# Patient Record
Sex: Male | Born: 1935 | Race: White | Hispanic: No | Marital: Single | Smoking: Never smoker
Health system: Southern US, Community
[De-identification: ages and names within clinical notes are randomized; demographics above are authoritative.]

## PROBLEM LIST (undated history)

## (undated) DIAGNOSIS — N486 Induration penis plastica: Secondary | ICD-10-CM

## (undated) DIAGNOSIS — C61 Malignant neoplasm of prostate: Secondary | ICD-10-CM

## (undated) DIAGNOSIS — R194 Change in bowel habit: Secondary | ICD-10-CM

## (undated) DIAGNOSIS — N400 Enlarged prostate without lower urinary tract symptoms: Secondary | ICD-10-CM

## (undated) DIAGNOSIS — R972 Elevated prostate specific antigen [PSA]: Secondary | ICD-10-CM

## (undated) DIAGNOSIS — I10 Essential (primary) hypertension: Secondary | ICD-10-CM

## (undated) DIAGNOSIS — Z8719 Personal history of other diseases of the digestive system: Secondary | ICD-10-CM

## (undated) DIAGNOSIS — E039 Hypothyroidism, unspecified: Secondary | ICD-10-CM

## (undated) DIAGNOSIS — J449 Chronic obstructive pulmonary disease, unspecified: Secondary | ICD-10-CM

## (undated) DIAGNOSIS — D649 Anemia, unspecified: Secondary | ICD-10-CM

## (undated) HISTORY — DX: Benign prostatic hyperplasia without lower urinary tract symptoms: N40.0

## (undated) HISTORY — DX: Elevated prostate specific antigen (PSA): R97.20

## (undated) HISTORY — PX: FOOT SURGERY: SHX648

## (undated) HISTORY — PX: PROSTATE SURGERY: SHX751

## (undated) HISTORY — PX: APPENDECTOMY: SHX54

## (undated) HISTORY — DX: Malignant neoplasm of prostate: C61

## (undated) HISTORY — DX: Induration penis plastica: N48.6

## (undated) HISTORY — DX: Essential (primary) hypertension: I10

---

## 2006-06-11 ENCOUNTER — Ambulatory Visit: Payer: Self-pay | Admitting: Gastroenterology

## 2007-08-04 HISTORY — PX: CRYOABLATION: SHX1415

## 2008-04-16 ENCOUNTER — Ambulatory Visit: Payer: Self-pay | Admitting: Urology

## 2008-04-16 ENCOUNTER — Other Ambulatory Visit: Payer: Self-pay

## 2008-04-24 ENCOUNTER — Ambulatory Visit: Payer: Self-pay | Admitting: Urology

## 2009-01-18 ENCOUNTER — Emergency Department (HOSPITAL_COMMUNITY): Admission: EM | Admit: 2009-01-18 | Discharge: 2009-01-19 | Payer: Self-pay | Admitting: Emergency Medicine

## 2010-11-10 LAB — URINALYSIS, ROUTINE W REFLEX MICROSCOPIC
Ketones, ur: NEGATIVE mg/dL
Nitrite: NEGATIVE
Protein, ur: NEGATIVE mg/dL
Urobilinogen, UA: 0.2 mg/dL (ref 0.0–1.0)
pH: 5 (ref 5.0–8.0)

## 2010-11-10 LAB — COMPREHENSIVE METABOLIC PANEL
Albumin: 3.7 g/dL (ref 3.5–5.2)
BUN: 25 mg/dL — ABNORMAL HIGH (ref 6–23)
Chloride: 105 mEq/L (ref 96–112)
Creatinine, Ser: 1.28 mg/dL (ref 0.4–1.5)
GFR calc non Af Amer: 54 mL/min — ABNORMAL LOW (ref >60)
Total Bilirubin: 0.7 mg/dL (ref 0.3–1.2)

## 2010-11-10 LAB — POCT CARDIAC MARKERS
CKMB, poc: <1 ng/mL — ABNORMAL LOW (ref 1.0–8.0)
Myoglobin, poc: 294 ng/mL (ref 12–200)

## 2010-11-10 LAB — CBC
HCT: 44.8 % (ref 39.0–52.0)
Hemoglobin: 15.2 g/dL (ref 13.0–17.0)
MCHC: 33.8 g/dL (ref 30.0–36.0)
RDW: 12.2 % (ref 11.5–15.5)

## 2010-11-10 LAB — POCT I-STAT, CHEM 8
Calcium, Ion: 1.15 mmol/L (ref 1.12–1.32)
Chloride: 106 mEq/L (ref 96–112)
Glucose, Bld: 149 mg/dL — ABNORMAL HIGH (ref 70–99)
HCT: 46 % (ref 39.0–52.0)

## 2010-11-10 LAB — DIFFERENTIAL
Basophils Absolute: 0 10*3/uL (ref 0.0–0.1)
Basophils Relative: 0 % (ref 0–1)
Eosinophils Absolute: 0 10*3/uL (ref 0.0–0.7)
Eosinophils Relative: 0 % (ref 0–5)
Monocytes Absolute: 0.8 10*3/uL (ref 0.1–1.0)

## 2014-03-31 DIAGNOSIS — D510 Vitamin B12 deficiency anemia due to intrinsic factor deficiency: Secondary | ICD-10-CM | POA: Insufficient documentation

## 2014-03-31 DIAGNOSIS — E039 Hypothyroidism, unspecified: Secondary | ICD-10-CM | POA: Insufficient documentation

## 2014-03-31 DIAGNOSIS — E785 Hyperlipidemia, unspecified: Secondary | ICD-10-CM | POA: Insufficient documentation

## 2015-04-11 DIAGNOSIS — Z0000 Encounter for general adult medical examination without abnormal findings: Secondary | ICD-10-CM | POA: Insufficient documentation

## 2015-05-20 ENCOUNTER — Encounter: Payer: Self-pay | Admitting: *Deleted

## 2015-05-24 ENCOUNTER — Other Ambulatory Visit: Payer: Commercial Managed Care - HMO

## 2015-05-24 DIAGNOSIS — R972 Elevated prostate specific antigen [PSA]: Secondary | ICD-10-CM

## 2015-05-25 LAB — PSA: Prostate Specific Ag, Serum: 0.6 ng/mL (ref 0.0–4.0)

## 2015-05-31 ENCOUNTER — Ambulatory Visit (INDEPENDENT_AMBULATORY_CARE_PROVIDER_SITE_OTHER): Payer: Commercial Managed Care - HMO | Admitting: Urology

## 2015-05-31 ENCOUNTER — Encounter: Payer: Self-pay | Admitting: Urology

## 2015-05-31 VITALS — BP 160/82 | HR 64 | Ht 70.0 in | Wt 173.6 lb

## 2015-05-31 DIAGNOSIS — C61 Malignant neoplasm of prostate: Secondary | ICD-10-CM | POA: Insufficient documentation

## 2015-05-31 NOTE — Progress Notes (Signed)
05/31/2015 11:29 AM   Christian Garcia [birth year] [id number]  Referring provider: Kirk Ruths, MD [facility] [place], [place] [address]  Chief Complaint  Patient presents with  . Bladder Cancer    6 month recheck    HPI: Patient is an 81 year old white male with prostate cancer who presents today for biannual follow-up.  Patient underwent cryoablation in September 2009 for a Gleason's 7 adenocarcinoma of the prostate.  He has no urinary complaints at this time.  He states he feels well.  He denies fevers, chills, nausea or vomiting.  He also is not having dysuria, suprapubic pain or gross hematuria.  He is not having pain, appetite loss or weight loss.   PMH: Past Medical History  Diagnosis Date  . BPH (benign prostatic hypertrophy)   . Elevated PSA   . Peyronie's disease   . HTN (hypertension)   . Prostate cancer [facility])     Surgical History: Past Surgical History  Procedure Laterality Date  . Cryoablation  2009    prostate  . Appendectomy      Home Medications:    Medication List       This list is accurate as of: 05/31/15 11:29 AM.  Always use your most recent med list.               VITAMIN B-12 IJ  Inject 1,000 mLs as directed.        Allergies:  Allergies  Allergen Reactions  . Sulfa Antibiotics     Family History: Family History  Problem Relation Age of Onset  . Prostate cancer Brother     x 3  . Breast cancer Sister   . Kidney disease Neg Hx     Social History:  reports that he has never smoked. He does not have any smokeless tobacco history on file. He reports that he does not drink alcohol or use illicit drugs.  ROS: UROLOGY Frequent Urination?: No Hard to postpone urination?: No Burning/pain with urination?: No Get up at night to urinate?: No Leakage of urine?: No Urine stream starts and stops?: No Trouble starting stream?: No Do you have to strain to urinate?: No Blood in urine?:  No Urinary tract infection?: No Sexually transmitted disease?: No Injury to kidneys or bladder?: No Painful intercourse?: No Weak stream?: No Erection problems?: No Penile pain?: No  Gastrointestinal Nausea?: No Vomiting?: No Indigestion/heartburn?: No Diarrhea?: No Constipation?: No  Constitutional Fever: No Night sweats?: No Weight loss?: No Fatigue?: No  Skin Skin rash/lesions?: No Itching?: No  Eyes Blurred vision?: No Double vision?: No  Ears/Nose/Throat Sore throat?: No Sinus problems?: No  Hematologic/Lymphatic Swollen glands?: No Easy bruising?: No  Cardiovascular Leg swelling?: No Chest pain?: No  Respiratory Cough?: No Shortness of breath?: No  Endocrine Excessive thirst?: No  Musculoskeletal Back pain?: No Joint pain?: No  Neurological Headaches?: No Dizziness?: No  Psychologic Depression?: No Anxiety?: No  Physical Exam: BP 160/82 mmHg  Pulse 64  Ht 5\' 10"  (1.778 m)  Wt 173 lb 9.6 oz (78.744 kg)  BMI 24.91 kg/m2  GU: No CVA tenderness.  No bladder fullness or masses.  Patient with uncircumcised phallus. Foreskin easily retracted  Urethral meatus is patent.  No penile discharge. No penile lesions or rashes. Scrotum without lesions, cysts, rashes and/or edema.  Testicles are located scrotally bilaterally. No masses are appreciated in the testicles. Left and right epididymis are normal. Rectal: Patient with  normal sphincter tone. Anus  and perineum without scarring or rashes. No rectal masses are appreciated. Prostate is approximately 30 grams, no nodules are appreciated. Seminal vesicles are normal.   Laboratory Data: Lab Results  Component Value Date   WBC 13.1* 01/18/2009   HGB 15.6 01/18/2009   HCT 46.0 01/18/2009   MCV 89.1 01/18/2009   PLT 169 01/18/2009    Lab Results  Component Value Date   CREATININE 1.28 01/18/2009    Lab Results  Component Value Date   PSA 0.6 05/24/2015   PSA history  0.6 ng/mL on  05/16/2013  0.5 ng/mL on 11/15/2013  0.6 ng/mL on 05/31/2014  0.5 ng/mL on 11/30/2014   Assessment & Plan:    1. Prostate cancer [facility]):   Patient underwent cryoablation in September 2009 for a Gleason's 7 adenocarcinoma of the prostate.  His PSAs have remained stable in the last few years alternating from 0.5-0.6.  We will continue monitoring every 6 months. He will return for a PSA and exam at that time.   Return in about 6 months (around 11/29/2015) for exam.  Zara Council, [facility]  [facility] [address] [place], [place] [address] [phone]

## 2015-08-06 DIAGNOSIS — E538 Deficiency of other specified B group vitamins: Secondary | ICD-10-CM | POA: Diagnosis not present

## 2015-08-21 DIAGNOSIS — L821 Other seborrheic keratosis: Secondary | ICD-10-CM | POA: Diagnosis not present

## 2015-08-21 DIAGNOSIS — D0439 Carcinoma in situ of skin of other parts of face: Secondary | ICD-10-CM | POA: Diagnosis not present

## 2015-08-21 DIAGNOSIS — Z85828 Personal history of other malignant neoplasm of skin: Secondary | ICD-10-CM | POA: Diagnosis not present

## 2015-08-21 DIAGNOSIS — X32XXXA Exposure to sunlight, initial encounter: Secondary | ICD-10-CM | POA: Diagnosis not present

## 2015-08-21 DIAGNOSIS — D485 Neoplasm of uncertain behavior of skin: Secondary | ICD-10-CM | POA: Diagnosis not present

## 2015-08-21 DIAGNOSIS — L565 Disseminated superficial actinic porokeratosis (DSAP): Secondary | ICD-10-CM | POA: Diagnosis not present

## 2015-08-21 DIAGNOSIS — L570 Actinic keratosis: Secondary | ICD-10-CM | POA: Diagnosis not present

## 2015-09-06 DIAGNOSIS — E538 Deficiency of other specified B group vitamins: Secondary | ICD-10-CM | POA: Diagnosis not present

## 2015-09-18 DIAGNOSIS — L905 Scar conditions and fibrosis of skin: Secondary | ICD-10-CM | POA: Diagnosis not present

## 2015-09-18 DIAGNOSIS — D0439 Carcinoma in situ of skin of other parts of face: Secondary | ICD-10-CM | POA: Diagnosis not present

## 2015-10-02 DIAGNOSIS — D510 Vitamin B12 deficiency anemia due to intrinsic factor deficiency: Secondary | ICD-10-CM | POA: Diagnosis not present

## 2015-10-02 DIAGNOSIS — E7800 Pure hypercholesterolemia, unspecified: Secondary | ICD-10-CM | POA: Diagnosis not present

## 2015-10-02 DIAGNOSIS — E039 Hypothyroidism, unspecified: Secondary | ICD-10-CM | POA: Diagnosis not present

## 2015-10-09 DIAGNOSIS — E7800 Pure hypercholesterolemia, unspecified: Secondary | ICD-10-CM | POA: Diagnosis not present

## 2015-10-09 DIAGNOSIS — E039 Hypothyroidism, unspecified: Secondary | ICD-10-CM | POA: Diagnosis not present

## 2015-10-09 DIAGNOSIS — E538 Deficiency of other specified B group vitamins: Secondary | ICD-10-CM | POA: Diagnosis not present

## 2015-10-09 DIAGNOSIS — D510 Vitamin B12 deficiency anemia due to intrinsic factor deficiency: Secondary | ICD-10-CM | POA: Diagnosis not present

## 2015-11-12 DIAGNOSIS — E538 Deficiency of other specified B group vitamins: Secondary | ICD-10-CM | POA: Diagnosis not present

## 2015-11-22 ENCOUNTER — Other Ambulatory Visit: Payer: PPO

## 2015-11-22 DIAGNOSIS — R972 Elevated prostate specific antigen [PSA]: Secondary | ICD-10-CM

## 2015-11-23 LAB — PSA: Prostate Specific Ag, Serum: 0.6 ng/mL (ref 0.0–4.0)

## 2015-11-29 ENCOUNTER — Encounter: Payer: Self-pay | Admitting: Urology

## 2015-11-29 ENCOUNTER — Ambulatory Visit (INDEPENDENT_AMBULATORY_CARE_PROVIDER_SITE_OTHER): Payer: PPO | Admitting: Urology

## 2015-11-29 VITALS — BP 172/73 | HR 73 | Ht 66.0 in | Wt 182.6 lb

## 2015-11-29 DIAGNOSIS — C61 Malignant neoplasm of prostate: Secondary | ICD-10-CM

## 2015-11-29 NOTE — Progress Notes (Signed)
10:45 AM   Christian Garcia [birth year] [id number]  Referring provider: No referring provider defined for this encounter.  Chief Complaint  Patient presents with  . Prostate Cancer    6 month follow up     HPI: Patient is an 82 year old white male with prostate cancer who presents today for biannual follow-up.  Patient underwent cryoablation in September 2009 for a Gleason's 7 adenocarcinoma of the prostate.  He has no urinary complaints at this time.  He states he feels well.  He denies fevers, chills, nausea or vomiting.  He also is not having dysuria, suprapubic pain or gross hematuria.  He is not having pain, appetite loss or weight loss.  His IPSS score is 0/0.  His recent PSA was 0.6 ng/mL on 11/22/2015.      IPSS      11/29/15 1000       International Prostate Symptom Score   How often have you had the sensation of not emptying your bladder? Not at All     How often have you had to urinate less than every two hours? Not at All     How often have you found you stopped and started again several times when you urinated? Not at All     How often have you found it difficult to postpone urination? Not at All     How often have you had a weak urinary stream? Not at All     How often have you had to strain to start urination? Not at All     How many times did you typically get up at night to urinate? None     Total IPSS Score 0     Quality of Life due to urinary symptoms   If you were to spend the rest of your life with your urinary condition just the way it is now how would you feel about that? Delighted        Score:  1-7 Mild 8-19 Moderate 20-35 Severe   PMH: Past Medical History  Diagnosis Date  . BPH (benign prostatic hypertrophy)   . Elevated PSA   . Peyronie's disease   . HTN (hypertension)   . Prostate cancer [facility])     Surgical History: Past Surgical History  Procedure Laterality Date  . Cryoablation  2009    prostate  . Appendectomy      Home  Medications:    Medication List       This list is accurate as of: 11/29/15 10:45 AM.  Always use your most recent med list.               VITAMIN B-12 IJ  Inject 1,000 mLs as directed.        Allergies:  Allergies  Allergen Reactions  . Sulfa Antibiotics     Family History: Family History  Problem Relation Age of Onset  . Prostate cancer Brother     x 3  . Breast cancer Sister   . Kidney disease Neg Hx     Social History:  reports that he has never smoked. He does not have any smokeless tobacco history on file. He reports that he does not drink alcohol or use illicit drugs.  ROS: UROLOGY Frequent Urination?: No Hard to postpone urination?: No Burning/pain with urination?: No Get up at night to urinate?: No Leakage of urine?: No Urine stream starts and stops?: No Trouble starting stream?: No Do you have to strain to urinate?: No Blood  in urine?: No Urinary tract infection?: No Sexually transmitted disease?: No Injury to kidneys or bladder?: No Painful intercourse?: No Weak stream?: No Erection problems?: No Penile pain?: No  Gastrointestinal Nausea?: No Vomiting?: No Indigestion/heartburn?: No Diarrhea?: No Constipation?: No  Constitutional Fever: No Night sweats?: No Weight loss?: No Fatigue?: No  Skin Skin rash/lesions?: No Itching?: No  Eyes Blurred vision?: No Double vision?: No  Ears/Nose/Throat Sore throat?: No Sinus problems?: No  Hematologic/Lymphatic Swollen glands?: No Easy bruising?: No  Cardiovascular Leg swelling?: No Chest pain?: No  Respiratory Cough?: No Shortness of breath?: No  Endocrine Excessive thirst?: No  Musculoskeletal Back pain?: No Joint pain?: No  Neurological Headaches?: No Dizziness?: No  Psychologic Depression?: No Anxiety?: No  Physical Exam: BP 172/73 mmHg  Pulse 73  Ht 5\' 6"  (1.676 m)  Wt 182 lb 9.6 oz (82.827 kg)  BMI 29.49 kg/m2  GU: No CVA tenderness.  No bladder fullness  or masses.  Patient with uncircumcised phallus. Foreskin easily retracted  Urethral meatus is patent.  No penile discharge. No penile lesions or rashes. Scrotum without lesions, cysts, rashes and/or edema.  Testicles are located scrotally bilaterally. No masses are appreciated in the testicles. Left and right epididymis are normal. Rectal: Patient with  normal sphincter tone. Anus and perineum without scarring or rashes. No rectal masses are appreciated. Prostate is approximately 30 grams, no nodules are appreciated. Seminal vesicles are normal.   Laboratory Data: Lab Results  Component Value Date   WBC 13.1* 01/18/2009   HGB 15.6 01/18/2009   HCT 46.0 01/18/2009   MCV 89.1 01/18/2009   PLT 169 01/18/2009    Lab Results  Component Value Date   CREATININE 1.28 01/18/2009    PSA history  0.6 ng/mL on 05/16/2013  0.5 ng/mL on 11/15/2013  0.6 ng/mL on 05/31/2014  0.5 ng/mL on 11/30/2014  0.6 ng/mL on 05/24/2015  0.6 ng/mL on 11/22/2015     Assessment & Plan:    1. Prostate cancer [facility]):   Patient underwent cryoablation in September 2009 for a Gleason's 7 adenocarcinoma of the prostate.  His PSAs have remained stable in the last few years alternating from 0.5-0.6.  We will continue monitoring every 6 months. He will return for a PSA, IPSS and exam at that time.   Return in about 6 months (around 05/30/2016) for IPSS and score.  Zara Council, [facility] [address], [place], [place] [address] [phone]

## 2015-12-13 DIAGNOSIS — E538 Deficiency of other specified B group vitamins: Secondary | ICD-10-CM | POA: Diagnosis not present

## 2016-01-16 DIAGNOSIS — Z85828 Personal history of other malignant neoplasm of skin: Secondary | ICD-10-CM | POA: Diagnosis not present

## 2016-01-16 DIAGNOSIS — X32XXXA Exposure to sunlight, initial encounter: Secondary | ICD-10-CM | POA: Diagnosis not present

## 2016-01-16 DIAGNOSIS — L821 Other seborrheic keratosis: Secondary | ICD-10-CM | POA: Diagnosis not present

## 2016-01-16 DIAGNOSIS — Z08 Encounter for follow-up examination after completed treatment for malignant neoplasm: Secondary | ICD-10-CM | POA: Diagnosis not present

## 2016-01-16 DIAGNOSIS — L570 Actinic keratosis: Secondary | ICD-10-CM | POA: Diagnosis not present

## 2016-01-21 DIAGNOSIS — E538 Deficiency of other specified B group vitamins: Secondary | ICD-10-CM | POA: Diagnosis not present

## 2016-02-21 DIAGNOSIS — E538 Deficiency of other specified B group vitamins: Secondary | ICD-10-CM | POA: Diagnosis not present

## 2016-03-25 DIAGNOSIS — E538 Deficiency of other specified B group vitamins: Secondary | ICD-10-CM | POA: Diagnosis not present

## 2016-04-07 DIAGNOSIS — E039 Hypothyroidism, unspecified: Secondary | ICD-10-CM | POA: Diagnosis not present

## 2016-04-07 DIAGNOSIS — D510 Vitamin B12 deficiency anemia due to intrinsic factor deficiency: Secondary | ICD-10-CM | POA: Diagnosis not present

## 2016-04-07 DIAGNOSIS — E7800 Pure hypercholesterolemia, unspecified: Secondary | ICD-10-CM | POA: Diagnosis not present

## 2016-04-14 DIAGNOSIS — Z0000 Encounter for general adult medical examination without abnormal findings: Secondary | ICD-10-CM | POA: Diagnosis not present

## 2016-04-14 DIAGNOSIS — D510 Vitamin B12 deficiency anemia due to intrinsic factor deficiency: Secondary | ICD-10-CM | POA: Diagnosis not present

## 2016-04-14 DIAGNOSIS — Z23 Encounter for immunization: Secondary | ICD-10-CM | POA: Diagnosis not present

## 2016-04-14 DIAGNOSIS — E039 Hypothyroidism, unspecified: Secondary | ICD-10-CM | POA: Diagnosis not present

## 2016-04-14 DIAGNOSIS — E7800 Pure hypercholesterolemia, unspecified: Secondary | ICD-10-CM | POA: Diagnosis not present

## 2016-04-28 DIAGNOSIS — E538 Deficiency of other specified B group vitamins: Secondary | ICD-10-CM | POA: Diagnosis not present

## 2016-05-22 ENCOUNTER — Other Ambulatory Visit: Payer: Self-pay

## 2016-05-22 DIAGNOSIS — Z8546 Personal history of malignant neoplasm of prostate: Secondary | ICD-10-CM

## 2016-05-25 ENCOUNTER — Other Ambulatory Visit: Payer: PPO

## 2016-05-25 DIAGNOSIS — Z8546 Personal history of malignant neoplasm of prostate: Secondary | ICD-10-CM

## 2016-05-26 LAB — PSA: PROSTATE SPECIFIC AG, SERUM: 0.7 ng/mL (ref 0.0–4.0)

## 2016-05-29 DIAGNOSIS — E538 Deficiency of other specified B group vitamins: Secondary | ICD-10-CM | POA: Diagnosis not present

## 2016-06-01 ENCOUNTER — Ambulatory Visit: Payer: PPO | Admitting: Urology

## 2016-06-01 ENCOUNTER — Encounter: Payer: Self-pay | Admitting: Urology

## 2016-06-01 VITALS — BP 147/83 | HR 67 | Ht 66.0 in | Wt 182.6 lb

## 2016-06-01 DIAGNOSIS — C61 Malignant neoplasm of prostate: Secondary | ICD-10-CM

## 2016-06-01 NOTE — Progress Notes (Signed)
11:46 AM   Christian Garcia 13-Jul-1929 [id number]  Referring provider: Kirk Ruths, MD [name], [place] [address]  Chief Complaint  Patient presents with  . Prostate Cancer    Follow up    HPI: Patient is an 82 year old Caucasian male with prostate cancer who presents today for biannual follow-up for prostate cancer.  Patient underwent cryoablation in September 2009 for a Gleason's 7 adenocarcinoma of the prostate.  His IPSS score is 0/0.  His previous IPSS score was 0/0.  His recent PSA was 0.7 ng/mL on 05/25/2106.  He has no urinary complaints at this time.  He states he feels well.  He denies fevers, chills, nausea or vomiting.  He also is not having dysuria, suprapubic pain or gross hematuria.  He is not having pain, appetite loss or weight loss.        IPSS    Row Name 06/01/16 1100         International Prostate Symptom Score   How often have you had the sensation of not emptying your bladder? Not at All     How often have you had to urinate less than every two hours? Not at All     How often have you found you stopped and started again several times when you urinated? Not at All     How often have you found it difficult to postpone urination? Not at All     How often have you had a weak urinary stream? Not at All     How often have you had to strain to start urination? Not at All     How many times did you typically get up at night to urinate? None     Total IPSS Score 0       Quality of Life due to urinary symptoms   If you were to spend the rest of your life with your urinary condition just the way it is now how would you feel about that? Delighted        Score:  1-7 Mild 8-19 Moderate 20-35 Severe   PMH: Past Medical History:  Diagnosis Date  . BPH (benign prostatic hypertrophy)   . Elevated PSA   . HTN (hypertension)   . Peyronie's disease   . Prostate cancer [facility])     Surgical History: Past  Surgical History:  Procedure Laterality Date  . APPENDECTOMY    . CRYOABLATION  2009   prostate    Home Medications:    Medication List       Accurate as of 06/01/16 11:46 AM. Always use your most recent med list.          VITAMIN B-12 IJ Inject 1,000 mLs as directed.       Allergies:  Allergies  Allergen Reactions  . Sulfa Antibiotics     Family History: Family History  Problem Relation Age of Onset  . Prostate cancer Brother     x 3  . Breast cancer Sister   . Kidney disease Neg Hx     Social History:  reports that he has never smoked. He has never used smokeless tobacco. He reports that he does not drink alcohol or use drugs.  ROS: UROLOGY Frequent Urination?: No Hard to postpone urination?: No Burning/pain with urination?: No Get up at night to urinate?: No Leakage of urine?: No Urine stream starts and stops?: No Trouble starting stream?: No Do you have to  strain to urinate?: No Blood in urine?: No Urinary tract infection?: No Sexually transmitted disease?: No Injury to kidneys or bladder?: No Painful intercourse?: No Weak stream?: No Erection problems?: No Penile pain?: No  Gastrointestinal Nausea?: No Vomiting?: No Indigestion/heartburn?: No Diarrhea?: No Constipation?: No  Constitutional Fever: No Night sweats?: No Weight loss?: No Fatigue?: No  Skin Skin rash/lesions?: No Itching?: No  Eyes Blurred vision?: No Double vision?: No  Ears/Nose/Throat Sore throat?: No Sinus problems?: No  Hematologic/Lymphatic Swollen glands?: No Easy bruising?: No  Cardiovascular Leg swelling?: No Chest pain?: No  Respiratory Cough?: No Shortness of breath?: No  Endocrine Excessive thirst?: No  Musculoskeletal Back pain?: No Joint pain?: No  Neurological Headaches?: No Dizziness?: No  Psychologic Depression?: No Anxiety?: No  Physical Exam: BP (!) 147/83 (BP Location: Left Arm, Patient Position: Sitting, Cuff Size:  Normal)   Pulse 67   Ht 5\' 6"  (1.676 m)   Wt 182 lb 9.6 oz (82.8 kg)   BMI 29.47 kg/m   GU: No CVA tenderness.  No bladder fullness or masses.  Patient with uncircumcised phallus. Foreskin easily retracted  Urethral meatus is patent.  No penile discharge. No penile lesions or rashes. Scrotum without lesions, cysts, rashes and/or edema.  Testicles are located scrotally bilaterally. No masses are appreciated in the testicles. Left and right epididymis are normal. Rectal: Patient with  normal sphincter tone. Anus and perineum without scarring or rashes. No rectal masses are appreciated. Prostate is approximately 30 grams, no nodules are appreciated. Seminal vesicles are normal.   Laboratory Data: Lab Results  Component Value Date   WBC 13.1 (H) 01/18/2009   HGB 15.6 01/18/2009   HCT 46.0 01/18/2009   MCV 89.1 01/18/2009   PLT 169 01/18/2009    Lab Results  Component Value Date   CREATININE 1.28 01/18/2009    PSA history  0.6 ng/mL on 05/16/2013  0.5 ng/mL on 11/15/2013  0.6 ng/mL on 05/31/2014  0.5 ng/mL on 11/30/2014  0.6 ng/mL on 05/24/2015  0.6 ng/mL on 11/22/2015  0.7 ng/mL on 05/25/2016     Assessment & Plan:    1. Prostate cancer [facility]):   Patient underwent cryoablation in September 2009 for a Gleason's 7 adenocarcinoma of the prostate.  His current PSA is 0.7 ng/mL on 05/25/2016.  We will continue monitoring every 6 months. He will return for a PSA, IPSS and exam at that time.   Return in about 6 months (around 11/30/2016) for IPSS, PSA and exam.  Zara Council, [facility]  [facility] [address] [place], [place] [address] [phone]

## 2016-07-02 DIAGNOSIS — E538 Deficiency of other specified B group vitamins: Secondary | ICD-10-CM | POA: Diagnosis not present

## 2016-08-05 DIAGNOSIS — R6889 Other general symptoms and signs: Secondary | ICD-10-CM | POA: Diagnosis not present

## 2016-08-13 DIAGNOSIS — E538 Deficiency of other specified B group vitamins: Secondary | ICD-10-CM | POA: Diagnosis not present

## 2016-09-14 DIAGNOSIS — E538 Deficiency of other specified B group vitamins: Secondary | ICD-10-CM | POA: Diagnosis not present

## 2016-09-15 DIAGNOSIS — X32XXXA Exposure to sunlight, initial encounter: Secondary | ICD-10-CM | POA: Diagnosis not present

## 2016-09-15 DIAGNOSIS — Z08 Encounter for follow-up examination after completed treatment for malignant neoplasm: Secondary | ICD-10-CM | POA: Diagnosis not present

## 2016-09-15 DIAGNOSIS — L570 Actinic keratosis: Secondary | ICD-10-CM | POA: Diagnosis not present

## 2016-09-15 DIAGNOSIS — L821 Other seborrheic keratosis: Secondary | ICD-10-CM | POA: Diagnosis not present

## 2016-09-15 DIAGNOSIS — D225 Melanocytic nevi of trunk: Secondary | ICD-10-CM | POA: Diagnosis not present

## 2016-09-15 DIAGNOSIS — Z85828 Personal history of other malignant neoplasm of skin: Secondary | ICD-10-CM | POA: Diagnosis not present

## 2016-10-06 DIAGNOSIS — E039 Hypothyroidism, unspecified: Secondary | ICD-10-CM | POA: Diagnosis not present

## 2016-10-06 DIAGNOSIS — E7800 Pure hypercholesterolemia, unspecified: Secondary | ICD-10-CM | POA: Diagnosis not present

## 2016-10-06 DIAGNOSIS — D510 Vitamin B12 deficiency anemia due to intrinsic factor deficiency: Secondary | ICD-10-CM | POA: Diagnosis not present

## 2016-10-06 DIAGNOSIS — Z0000 Encounter for general adult medical examination without abnormal findings: Secondary | ICD-10-CM | POA: Diagnosis not present

## 2016-10-13 DIAGNOSIS — E039 Hypothyroidism, unspecified: Secondary | ICD-10-CM | POA: Diagnosis not present

## 2016-10-13 DIAGNOSIS — E7800 Pure hypercholesterolemia, unspecified: Secondary | ICD-10-CM | POA: Diagnosis not present

## 2016-10-13 DIAGNOSIS — D510 Vitamin B12 deficiency anemia due to intrinsic factor deficiency: Secondary | ICD-10-CM | POA: Diagnosis not present

## 2016-10-15 DIAGNOSIS — D510 Vitamin B12 deficiency anemia due to intrinsic factor deficiency: Secondary | ICD-10-CM | POA: Diagnosis not present

## 2016-11-16 DIAGNOSIS — D510 Vitamin B12 deficiency anemia due to intrinsic factor deficiency: Secondary | ICD-10-CM | POA: Diagnosis not present

## 2016-12-03 ENCOUNTER — Other Ambulatory Visit: Payer: Self-pay | Admitting: *Deleted

## 2016-12-03 ENCOUNTER — Other Ambulatory Visit
Admission: RE | Admit: 2016-12-03 | Discharge: 2016-12-03 | Disposition: A | Discharge status: Home / Self Care | Payer: PPO | Source: Ambulatory Visit | Attending: Urology | Admitting: Urology

## 2016-12-03 DIAGNOSIS — Z8546 Personal history of malignant neoplasm of prostate: Secondary | ICD-10-CM | POA: Insufficient documentation

## 2016-12-03 LAB — PSA: PSA: 0.64 ng/mL (ref 0.00–4.00)

## 2016-12-03 NOTE — Progress Notes (Signed)
8:24 AM   Christian Garcia Oct 31, 1928 [id number]  Referring provider: Kirk Ruths, MD [place], [place] [address]  Chief Complaint  Patient presents with  . Prostate Cancer    6 month follow up     HPI: Patient is an 83 year old Caucasian male with prostate cancer who presents today for biannual follow-up for prostate cancer.  Patient underwent cryoablation in September 2009 for a Gleason's 7 adenocarcinoma of the prostate.  His IPSS score is 0/0.  His previous IPSS score was 0/0.  His recent PSA was 0.64 ng/mL on 12/03/2016.    He has no urinary complaints at this time.  He states he feels well.  He denies fevers, chills, nausea or vomiting.  He also is not having dysuria, suprapubic pain or gross hematuria.  He is not having pain, appetite loss or weight loss.        IPSS    Row Name 12/04/16 0800         International Prostate Symptom Score   How often have you had the sensation of not emptying your bladder? Not at All     How often have you had to urinate less than every two hours? Not at All     How often have you found you stopped and started again several times when you urinated? Not at All     How often have you found it difficult to postpone urination? Not at All     How often have you had a weak urinary stream? Not at All     How often have you had to strain to start urination? Not at All     How many times did you typically get up at night to urinate? None     Total IPSS Score 0       Quality of Life due to urinary symptoms   If you were to spend the rest of your life with your urinary condition just the way it is now how would you feel about that? Delighted        Score:  1-7 Mild 8-19 Moderate 20-35 Severe   PMH: Past Medical History:  Diagnosis Date  . BPH (benign prostatic hypertrophy)   . Elevated PSA   . HTN (hypertension)   . Peyronie's disease   . Prostate cancer [facility])     Surgical  History: Past Surgical History:  Procedure Laterality Date  . APPENDECTOMY    . CRYOABLATION  2009   prostate  . FOOT SURGERY  right   ganglion cyst removed    Home Medications:  Allergies as of 12/04/2016      Reactions   Sulfa Antibiotics       Medication List       Accurate as of 12/04/16  8:24 AM. Always use your most recent med list.          levothyroxine 50 MCG tablet Commonly known as:  SYNTHROID, LEVOTHROID Take by mouth.   VITAMIN B-12 IJ Inject 1,000 mLs as directed.       Allergies:  Allergies  Allergen Reactions  . Sulfa Antibiotics     Family History: Family History  Problem Relation Age of Onset  . Prostate cancer Brother     x 3  . Breast cancer Sister   . Kidney disease Neg Hx   . Kidney cancer Neg Hx   . Bladder Cancer Neg Hx     Social History:  reports that he has never smoked. He has never used smokeless tobacco. He reports that he does not drink alcohol or use drugs.  ROS: UROLOGY Frequent Urination?: No Hard to postpone urination?: No Burning/pain with urination?: No Get up at night to urinate?: No Leakage of urine?: No Urine stream starts and stops?: No Trouble starting stream?: No Do you have to strain to urinate?: No Blood in urine?: No Urinary tract infection?: No Sexually transmitted disease?: No Injury to kidneys or bladder?: No Painful intercourse?: No Weak stream?: No Erection problems?: No Penile pain?: No  Gastrointestinal Nausea?: No Vomiting?: No Indigestion/heartburn?: No Diarrhea?: No Constipation?: No  Constitutional Fever: No Night sweats?: No Weight loss?: No Fatigue?: No  Skin Skin rash/lesions?: No Itching?: No  Eyes Blurred vision?: No Double vision?: No  Ears/Nose/Throat Sore throat?: No Sinus problems?: No  Hematologic/Lymphatic Swollen glands?: No Easy bruising?: No  Cardiovascular Leg swelling?: No Chest pain?: No  Respiratory Cough?: No Shortness of breath?:  No  Endocrine Excessive thirst?: No  Musculoskeletal Back pain?: No Joint pain?: No  Neurological Headaches?: No Dizziness?: No  Psychologic Depression?: No Anxiety?: No  Physical Exam: BP (!) 143/72   Pulse 81   Ht 5\' 10"  (1.778 m)   Wt 181 lb (82.1 kg)   BMI 25.97 kg/m   GU: No CVA tenderness.  No bladder fullness or masses.  Patient with uncircumcised phallus. Foreskin easily retracted  Urethral meatus is patent.  No penile discharge. No penile lesions or rashes. Scrotum without lesions, cysts, rashes and/or edema.  Testicles are located scrotally bilaterally. No masses are appreciated in the testicles. Left and right epididymis are normal. Rectal: Patient with  normal sphincter tone. Anus and perineum without scarring or rashes. No rectal masses are appreciated. Prostate is approximately 30 grams, no nodules are appreciated. Seminal vesicles are normal.   Laboratory Data: Lab Results  Component Value Date   WBC 13.1 (H) 01/18/2009   HGB 15.6 01/18/2009   HCT 46.0 01/18/2009   MCV 89.1 01/18/2009   PLT 169 01/18/2009    Lab Results  Component Value Date   CREATININE 1.28 01/18/2009    PSA history  0.6 ng/mL on 05/16/2013  0.5 ng/mL on 11/15/2013  0.6 ng/mL on 05/31/2014  0.5 ng/mL on 11/30/2014  0.6 ng/mL on 05/24/2015  0.6 ng/mL on 11/22/2015  0.7 ng/mL on 05/25/2016  0.64 ng/mL on 12/03/2016     Assessment & Plan:    1. Prostate cancer [facility]):   Patient underwent cryoablation in September 2009 for a Gleason's 7 adenocarcinoma of the prostate.  His current PSA is 0.64 ng/mL on 12/03/2016.  We will continue monitoring every 6 months. He will return for a PSA, IPSS and exam at that time.   Return in about 6 months (around 06/06/2017) for IPSS, PSA and exam.  Zara Council, [facility]  [facility] [address], [place],  [address] [phone]

## 2016-12-04 ENCOUNTER — Encounter: Payer: Self-pay | Admitting: Urology

## 2016-12-04 ENCOUNTER — Ambulatory Visit (INDEPENDENT_AMBULATORY_CARE_PROVIDER_SITE_OTHER): Payer: PPO | Admitting: Urology

## 2016-12-04 VITALS — BP 143/72 | HR 81 | Ht 70.0 in | Wt 181.0 lb

## 2016-12-04 DIAGNOSIS — C61 Malignant neoplasm of prostate: Secondary | ICD-10-CM | POA: Diagnosis not present

## 2016-12-17 DIAGNOSIS — E538 Deficiency of other specified B group vitamins: Secondary | ICD-10-CM | POA: Diagnosis not present

## 2017-01-06 DIAGNOSIS — E039 Hypothyroidism, unspecified: Secondary | ICD-10-CM | POA: Diagnosis not present

## 2017-01-08 DIAGNOSIS — H2513 Age-related nuclear cataract, bilateral: Secondary | ICD-10-CM | POA: Diagnosis not present

## 2017-01-13 DIAGNOSIS — E7800 Pure hypercholesterolemia, unspecified: Secondary | ICD-10-CM | POA: Diagnosis not present

## 2017-01-13 DIAGNOSIS — E039 Hypothyroidism, unspecified: Secondary | ICD-10-CM | POA: Diagnosis not present

## 2017-01-13 DIAGNOSIS — D510 Vitamin B12 deficiency anemia due to intrinsic factor deficiency: Secondary | ICD-10-CM | POA: Diagnosis not present

## 2017-01-18 DIAGNOSIS — D510 Vitamin B12 deficiency anemia due to intrinsic factor deficiency: Secondary | ICD-10-CM | POA: Diagnosis not present

## 2017-01-21 DIAGNOSIS — B353 Tinea pedis: Secondary | ICD-10-CM | POA: Diagnosis not present

## 2017-01-26 DIAGNOSIS — D485 Neoplasm of uncertain behavior of skin: Secondary | ICD-10-CM | POA: Diagnosis not present

## 2017-01-26 DIAGNOSIS — B079 Viral wart, unspecified: Secondary | ICD-10-CM | POA: Diagnosis not present

## 2017-02-18 DIAGNOSIS — D510 Vitamin B12 deficiency anemia due to intrinsic factor deficiency: Secondary | ICD-10-CM | POA: Diagnosis not present

## 2017-03-05 DIAGNOSIS — H2513 Age-related nuclear cataract, bilateral: Secondary | ICD-10-CM | POA: Diagnosis not present

## 2017-03-16 ENCOUNTER — Encounter: Payer: Self-pay | Admitting: *Deleted

## 2017-03-21 DIAGNOSIS — L03011 Cellulitis of right finger: Secondary | ICD-10-CM | POA: Diagnosis not present

## 2017-03-22 DIAGNOSIS — E538 Deficiency of other specified B group vitamins: Secondary | ICD-10-CM | POA: Diagnosis not present

## 2017-03-22 NOTE — Discharge Instructions (Signed)

## 2017-03-24 ENCOUNTER — Encounter
Admission: RE | Disposition: A | Discharge status: Home / Self Care | Payer: Self-pay | Source: Ambulatory Visit | Attending: Ophthalmology

## 2017-03-24 ENCOUNTER — Ambulatory Visit: Payer: PPO | Admitting: Anesthesiology

## 2017-03-24 ENCOUNTER — Ambulatory Visit
Admission: RE | Admit: 2017-03-24 | Discharge: 2017-03-24 | Disposition: A | Discharge status: Home / Self Care | Payer: PPO | Source: Ambulatory Visit | Attending: Ophthalmology | Admitting: Ophthalmology

## 2017-03-24 DIAGNOSIS — H2513 Age-related nuclear cataract, bilateral: Secondary | ICD-10-CM | POA: Diagnosis present

## 2017-03-24 DIAGNOSIS — H2512 Age-related nuclear cataract, left eye: Secondary | ICD-10-CM | POA: Diagnosis not present

## 2017-03-24 DIAGNOSIS — I10 Essential (primary) hypertension: Secondary | ICD-10-CM | POA: Diagnosis not present

## 2017-03-24 DIAGNOSIS — E039 Hypothyroidism, unspecified: Secondary | ICD-10-CM | POA: Insufficient documentation

## 2017-03-24 DIAGNOSIS — Z79899 Other long term (current) drug therapy: Secondary | ICD-10-CM | POA: Insufficient documentation

## 2017-03-24 DIAGNOSIS — D649 Anemia, unspecified: Secondary | ICD-10-CM | POA: Diagnosis not present

## 2017-03-24 HISTORY — PX: CATARACT EXTRACTION W/PHACO: SHX586

## 2017-03-24 SURGERY — PHACOEMULSIFICATION, CATARACT, WITH IOL INSERTION
Anesthesia: Monitor Anesthesia Care | Laterality: Left | Wound class: Clean

## 2017-03-24 MED ORDER — NA HYALUR & NA CHOND-NA HYALUR 0.4-0.35 ML IO KIT
PACK | INTRAOCULAR | Status: DC | PRN
  Administered 2017-03-24: 1 mL via INTRAOCULAR

## 2017-03-24 MED ORDER — LIDOCAINE HCL 2 % IJ SOLN
INTRAMUSCULAR | Status: DC | PRN
  Administered 2017-03-24: 1 mL

## 2017-03-24 MED ORDER — EPINEPHRINE PF 1 MG/ML IJ SOLN
INTRAOCULAR | Status: DC | PRN
  Administered 2017-03-24: 66 mL via OPHTHALMIC

## 2017-03-24 MED ORDER — FENTANYL CITRATE (PF) 100 MCG/2ML IJ SOLN: 25.0000 ug | INTRAMUSCULAR | Status: DC | PRN

## 2017-03-24 MED ORDER — MIDAZOLAM HCL 2 MG/2ML IJ SOLN
INTRAMUSCULAR | Status: DC | PRN
  Administered 2017-03-24: 2 mg via INTRAVENOUS

## 2017-03-24 MED ORDER — FENTANYL CITRATE (PF) 100 MCG/2ML IJ SOLN
INTRAMUSCULAR | Status: DC | PRN
  Administered 2017-03-24: 50 ug via INTRAVENOUS

## 2017-03-24 MED ORDER — CEFUROXIME OPHTHALMIC INJECTION 1 MG/0.1 ML
INJECTION | OPHTHALMIC | Status: DC | PRN
  Administered 2017-03-24: .2 mL via INTRACAMERAL

## 2017-03-24 MED ORDER — BRIMONIDINE TARTRATE-TIMOLOL 0.2-0.5 % OP SOLN
OPHTHALMIC | Status: DC | PRN
  Administered 2017-03-24: 1 [drp] via OPHTHALMIC

## 2017-03-24 MED ORDER — LACTATED RINGERS IV SOLN: 10.0000 mL/h | INTRAVENOUS | Status: DC

## 2017-03-24 MED ORDER — ARMC OPHTHALMIC DILATING DROPS
1.0000 "application " | OPHTHALMIC | Status: DC | PRN
  Administered 2017-03-24 (×3): 1 via OPHTHALMIC

## 2017-03-24 MED ORDER — OXYCODONE HCL 5 MG/5ML PO SOLN: 5.0000 mg | Freq: Once | ORAL | Status: DC | PRN

## 2017-03-24 MED ORDER — PROMETHAZINE HCL 25 MG/ML IJ SOLN: 6.2500 mg | INTRAMUSCULAR | Status: DC | PRN

## 2017-03-24 MED ORDER — MOXIFLOXACIN HCL 0.5 % OP SOLN
1.0000 [drp] | OPHTHALMIC | Status: DC | PRN
  Administered 2017-03-24 (×3): 1 [drp] via OPHTHALMIC

## 2017-03-24 MED ORDER — OXYCODONE HCL 5 MG PO TABS: 5.0000 mg | ORAL_TABLET | Freq: Once | ORAL | Status: DC | PRN

## 2017-03-24 MED ORDER — MEPERIDINE HCL 25 MG/ML IJ SOLN: 6.2500 mg | INTRAMUSCULAR | Status: DC | PRN

## 2017-03-24 SURGICAL SUPPLY — 25 items
CANNULA ANT/CHMB 27GA (MISCELLANEOUS) ×3 IMPLANT
CARTRIDGE ABBOTT (MISCELLANEOUS) IMPLANT
GLOVE SURG LX 7.5 STRW (GLOVE) ×2
GLOVE SURG LX STRL 7.5 STRW (GLOVE) ×1 IMPLANT
GLOVE SURG TRIUMPH 8.0 PF LTX (GLOVE) ×3 IMPLANT
GOWN STRL REUS W/ TWL LRG LVL3 (GOWN DISPOSABLE) ×2 IMPLANT
GOWN STRL REUS W/TWL LRG LVL3 (GOWN DISPOSABLE) ×4
LENS IOL TECNIS ITEC 20.0 (Intraocular Lens) ×3 IMPLANT
MARKER SKIN DUAL TIP RULER LAB (MISCELLANEOUS) ×3 IMPLANT
NDL RETROBULBAR .5 NSTRL (NEEDLE) IMPLANT
NEEDLE FILTER BLUNT 18X 1/2SAF (NEEDLE) ×2
NEEDLE FILTER BLUNT 18X1 1/2 (NEEDLE) ×1 IMPLANT
PACK CATARACT BRASINGTON (MISCELLANEOUS) ×3 IMPLANT
PACK EYE AFTER SURG (MISCELLANEOUS) ×3 IMPLANT
PACK OPTHALMIC (MISCELLANEOUS) ×3 IMPLANT
RING MALYGIN 7.0 (MISCELLANEOUS) IMPLANT
SUT ETHILON 10-0 CS-B-6CS-B-6 (SUTURE)
SUT VICRYL  9 0 (SUTURE)
SUT VICRYL 9 0 (SUTURE) IMPLANT
SUTURE EHLN 10-0 CS-B-6CS-B-6 (SUTURE) IMPLANT
SYR 3ML LL SCALE MARK (SYRINGE) ×3 IMPLANT
SYR 5ML LL (SYRINGE) ×3 IMPLANT
SYR TB 1ML LUER SLIP (SYRINGE) ×3 IMPLANT
WATER STERILE IRR 250ML POUR (IV SOLUTION) ×3 IMPLANT
WIPE NON LINTING 3.25X3.25 (MISCELLANEOUS) ×3 IMPLANT

## 2017-03-24 NOTE — Op Note (Signed)
OPERATIVE NOTE  Christian Garcia [id number] 03/24/2017   PREOPERATIVE DIAGNOSIS:  Nuclear sclerotic cataract left eye. H25.12   POSTOPERATIVE DIAGNOSIS:    Nuclear sclerotic cataract left eye.     PROCEDURE:  Phacoemusification with posterior chamber intraocular lens placement of the left eye   LENS:   Implant Name Type Inv. Item Serial No. Manufacturer Lot No. LRB No. Used  LENS IOL DIOP 20.0 - [id number] Intraocular Lens LENS IOL DIOP 20.0 [id number]   Left 1        ULTRASOUND TIME: 17  % of 02 minutes 59 seconds, CDE 10.0  SURGEON:  Wyonia Hough, MD   ANESTHESIA:  Topical with tetracaine drops and 2% Xylocaine jelly, augmented with 1% preservative-free intracameral lidocaine.    COMPLICATIONS:  None.   DESCRIPTION OF PROCEDURE:  The patient was identified in the holding room and transported to the operating room and placed in the supine position under the operating microscope.  The left eye was identified as the operative eye and it was prepped and draped in the usual sterile ophthalmic fashion.   A 1 millimeter clear-corneal paracentesis was made at the 1:30 position.  0.5 ml of preservative-free 1% lidocaine was injected into the anterior chamber.  The anterior chamber was filled with Viscoat viscoelastic.  A 2.4 millimeter keratome was used to make a near-clear corneal incision at the 10:30 position.  .  A curvilinear capsulorrhexis was made with a cystotome and capsulorrhexis forceps.  Balanced salt solution was used to hydrodissect and hydrodelineate the nucleus.   Phacoemulsification was then used in stop and chop fashion to remove the lens nucleus and epinucleus.  The remaining cortex was then removed using the irrigation and aspiration handpiece. Provisc was then placed into the capsular bag to distend it for lens placement.  A lens was then injected into the capsular bag.  The remaining viscoelastic was aspirated.   Wounds were hydrated with balanced salt  solution.  The anterior chamber was inflated to a physiologic pressure with balanced salt solution.  No wound leaks were noted. Cefuroxime 0.1 ml of a 10mg /ml solution was injected into the anterior chamber for a dose of 1 mg of intracameral antibiotic at the completion of the case.   Timolol and Brimonidine drops were applied to the eye.  The patient was taken to the recovery room in stable condition without complications of anesthesia or surgery.  [name] 03/24/2017, 9:41 AM

## 2017-03-24 NOTE — Anesthesia Preprocedure Evaluation (Signed)
Anesthesia Evaluation  Patient identified by MRN, date of birth, ID band Patient awake    Reviewed: Allergy & Precautions, H&P , NPO status , Patient's Chart, lab work & pertinent test results, reviewed documented beta blocker date and time   Airway Mallampati: II  TM Distance: >3 FB Neck ROM: full    Dental no notable dental hx.    Pulmonary neg pulmonary ROS,    Pulmonary exam normal breath sounds clear to auscultation       Cardiovascular Exercise Tolerance: Good hypertension, negative cardio ROS   Rhythm:regular Rate:Normal     Neuro/Psych negative neurological ROS  negative psych ROS   GI/Hepatic negative GI ROS, Neg liver ROS,   Endo/Other  Hypothyroidism   Renal/GU negative Renal ROS   Prostate CA    Musculoskeletal   Abdominal   Peds  Hematology  (+) anemia ,   Anesthesia Other Findings   Reproductive/Obstetrics negative OB ROS                             Anesthesia Physical Anesthesia Plan  ASA: II  Anesthesia Plan: MAC   Post-op Pain Management:    Induction:   PONV Risk Score and Plan:   Airway Management Planned:   Additional Equipment:   Intra-op Plan:   Post-operative Plan:   Informed Consent: I have reviewed the patients History and Physical, chart, labs and discussed the procedure including the risks, benefits and alternatives for the proposed anesthesia with the patient or authorized representative who has indicated his/her understanding and acceptance.   Dental Advisory Given  Plan Discussed with: CRNA  Anesthesia Plan Comments:         Anesthesia Quick Evaluation

## 2017-03-24 NOTE — Transfer of Care (Signed)
Immediate Anesthesia Transfer of Care Note  Patient: Christian Garcia  Procedure(s) Performed: Procedure(s): CATARACT EXTRACTION PHACO AND INTRAOCULAR LENS PLACEMENT (IOC) LEFT (Left)  Patient Location: PACU  Anesthesia Type: MAC  Level of Consciousness: awake, alert  and patient cooperative  Airway and Oxygen Therapy: Patient Spontanous Breathing and Patient connected to supplemental oxygen  Post-op Assessment: Post-op Vital signs reviewed, Patient's Cardiovascular Status Stable, Respiratory Function Stable, Patent Airway and No signs of Nausea or vomiting  Post-op Vital Signs: Reviewed and stable  Complications: No apparent anesthesia complications

## 2017-03-24 NOTE — Anesthesia Postprocedure Evaluation (Signed)
Anesthesia Post Note  Patient: Christian Garcia  Procedure(s) Performed: Procedure(s) (LRB): CATARACT EXTRACTION PHACO AND INTRAOCULAR LENS PLACEMENT (IOC) LEFT (Left)  Patient location during evaluation: PACU Anesthesia Type: MAC Level of consciousness: awake and alert Pain management: pain level controlled Vital Signs Assessment: post-procedure vital signs reviewed and stable Respiratory status: spontaneous breathing, nonlabored ventilation, respiratory function stable and patient connected to nasal cannula oxygen Cardiovascular status: stable and blood pressure returned to baseline Anesthetic complications: no    Romelia Bromell ELAINE

## 2017-03-24 NOTE — Anesthesia Procedure Notes (Signed)
Procedure Name: MAC Performed by: Lakedra Washington Pre-anesthesia Checklist: Patient identified, Emergency Drugs available, Suction available, Timeout performed and Patient being monitored Patient Re-evaluated:Patient Re-evaluated prior to inductionOxygen Delivery Method: Nasal cannula Placement Confirmation: positive ETCO2     

## 2017-03-24 NOTE — H&P (Signed)
The History and Physical notes are on paper, have been signed, and are to be scanned. The patient remains stable and unchanged from the H&P.   Previous H&P reviewed, patient examined, and there are no changes.  Kalin Amrhein 03/24/2017 8:55 AM

## 2017-03-26 ENCOUNTER — Encounter: Payer: Self-pay | Admitting: Ophthalmology

## 2017-03-26 DIAGNOSIS — L309 Dermatitis, unspecified: Secondary | ICD-10-CM | POA: Diagnosis not present

## 2017-03-26 DIAGNOSIS — L989 Disorder of the skin and subcutaneous tissue, unspecified: Secondary | ICD-10-CM | POA: Diagnosis not present

## 2017-04-09 DIAGNOSIS — L120 Bullous pemphigoid: Secondary | ICD-10-CM | POA: Diagnosis not present

## 2017-04-21 DIAGNOSIS — E039 Hypothyroidism, unspecified: Secondary | ICD-10-CM | POA: Diagnosis not present

## 2017-04-21 DIAGNOSIS — E7800 Pure hypercholesterolemia, unspecified: Secondary | ICD-10-CM | POA: Diagnosis not present

## 2017-04-21 DIAGNOSIS — D510 Vitamin B12 deficiency anemia due to intrinsic factor deficiency: Secondary | ICD-10-CM | POA: Diagnosis not present

## 2017-04-22 DIAGNOSIS — H2511 Age-related nuclear cataract, right eye: Secondary | ICD-10-CM | POA: Diagnosis not present

## 2017-04-27 ENCOUNTER — Encounter: Payer: Self-pay | Admitting: *Deleted

## 2017-04-28 DIAGNOSIS — E039 Hypothyroidism, unspecified: Secondary | ICD-10-CM | POA: Diagnosis not present

## 2017-04-28 DIAGNOSIS — D510 Vitamin B12 deficiency anemia due to intrinsic factor deficiency: Secondary | ICD-10-CM | POA: Diagnosis not present

## 2017-04-28 DIAGNOSIS — Z0000 Encounter for general adult medical examination without abnormal findings: Secondary | ICD-10-CM | POA: Diagnosis not present

## 2017-04-28 DIAGNOSIS — Z23 Encounter for immunization: Secondary | ICD-10-CM | POA: Diagnosis not present

## 2017-04-28 DIAGNOSIS — E7800 Pure hypercholesterolemia, unspecified: Secondary | ICD-10-CM | POA: Diagnosis not present

## 2017-04-29 NOTE — Discharge Instructions (Signed)

## 2017-05-05 ENCOUNTER — Encounter
Admission: RE | Disposition: A | Discharge status: Home / Self Care | Payer: Self-pay | Source: Ambulatory Visit | Attending: Ophthalmology

## 2017-05-05 ENCOUNTER — Ambulatory Visit
Admission: RE | Admit: 2017-05-05 | Discharge: 2017-05-05 | Disposition: A | Discharge status: Home / Self Care | Payer: PPO | Source: Ambulatory Visit | Attending: Ophthalmology | Admitting: Ophthalmology

## 2017-05-05 ENCOUNTER — Encounter: Payer: Self-pay | Admitting: *Deleted

## 2017-05-05 ENCOUNTER — Ambulatory Visit: Payer: PPO | Admitting: Anesthesiology

## 2017-05-05 DIAGNOSIS — Z85828 Personal history of other malignant neoplasm of skin: Secondary | ICD-10-CM | POA: Diagnosis not present

## 2017-05-05 DIAGNOSIS — Z8546 Personal history of malignant neoplasm of prostate: Secondary | ICD-10-CM | POA: Diagnosis not present

## 2017-05-05 DIAGNOSIS — Z882 Allergy status to sulfonamides status: Secondary | ICD-10-CM | POA: Insufficient documentation

## 2017-05-05 DIAGNOSIS — D649 Anemia, unspecified: Secondary | ICD-10-CM | POA: Insufficient documentation

## 2017-05-05 DIAGNOSIS — H2511 Age-related nuclear cataract, right eye: Secondary | ICD-10-CM | POA: Insufficient documentation

## 2017-05-05 DIAGNOSIS — E039 Hypothyroidism, unspecified: Secondary | ICD-10-CM | POA: Insufficient documentation

## 2017-05-05 DIAGNOSIS — Z79899 Other long term (current) drug therapy: Secondary | ICD-10-CM | POA: Insufficient documentation

## 2017-05-05 HISTORY — PX: CATARACT EXTRACTION W/PHACO: SHX586

## 2017-05-05 SURGERY — PHACOEMULSIFICATION, CATARACT, WITH IOL INSERTION
Anesthesia: Monitor Anesthesia Care | Laterality: Right | Wound class: Clean

## 2017-05-05 MED ORDER — MOXIFLOXACIN HCL 0.5 % OP SOLN
1.0000 [drp] | OPHTHALMIC | Status: DC | PRN
  Administered 2017-05-05 (×3): 1 [drp] via OPHTHALMIC

## 2017-05-05 MED ORDER — BRIMONIDINE TARTRATE-TIMOLOL 0.2-0.5 % OP SOLN
OPHTHALMIC | Status: DC | PRN
  Administered 2017-05-05: 1 [drp] via OPHTHALMIC

## 2017-05-05 MED ORDER — EPINEPHRINE PF 1 MG/ML IJ SOLN
INTRAMUSCULAR | Status: DC | PRN
  Administered 2017-05-05: 96 mL via OPHTHALMIC

## 2017-05-05 MED ORDER — ARMC OPHTHALMIC DILATING DROPS
1.0000 "application " | OPHTHALMIC | Status: DC | PRN
  Administered 2017-05-05 (×3): 1 via OPHTHALMIC

## 2017-05-05 MED ORDER — LIDOCAINE HCL (PF) 2 % IJ SOLN
INTRAOCULAR | Status: DC | PRN
  Administered 2017-05-05: 2 mL via INTRAMUSCULAR

## 2017-05-05 MED ORDER — FENTANYL CITRATE (PF) 100 MCG/2ML IJ SOLN
INTRAMUSCULAR | Status: DC | PRN
Start: 2017-05-05 — End: 2017-05-05
  Administered 2017-05-05: 50 ug via INTRAVENOUS

## 2017-05-05 MED ORDER — CEFUROXIME OPHTHALMIC INJECTION 1 MG/0.1 ML
INJECTION | OPHTHALMIC | Status: DC | PRN
  Administered 2017-05-05: 0.1 mL via INTRACAMERAL

## 2017-05-05 MED ORDER — MIDAZOLAM HCL 2 MG/2ML IJ SOLN
INTRAMUSCULAR | Status: DC | PRN
  Administered 2017-05-05: 1 mg via INTRAVENOUS

## 2017-05-05 MED ORDER — NA HYALUR & NA CHOND-NA HYALUR 0.4-0.35 ML IO KIT
PACK | INTRAOCULAR | Status: DC | PRN
  Administered 2017-05-05: 1 mL via INTRAOCULAR

## 2017-05-05 SURGICAL SUPPLY — 25 items
CANNULA ANT/CHMB 27GA (MISCELLANEOUS) ×3 IMPLANT
CARTRIDGE ABBOTT (MISCELLANEOUS) IMPLANT
GLOVE SURG LX 7.5 STRW (GLOVE) ×2
GLOVE SURG LX STRL 7.5 STRW (GLOVE) ×1 IMPLANT
GLOVE SURG TRIUMPH 8.0 PF LTX (GLOVE) ×3 IMPLANT
GOWN STRL REUS W/ TWL LRG LVL3 (GOWN DISPOSABLE) ×2 IMPLANT
GOWN STRL REUS W/TWL LRG LVL3 (GOWN DISPOSABLE) ×4
LENS IOL TECNIS ITEC 20.0 (Intraocular Lens) ×3 IMPLANT
MARKER SKIN DUAL TIP RULER LAB (MISCELLANEOUS) ×3 IMPLANT
NDL RETROBULBAR .5 NSTRL (NEEDLE) IMPLANT
NEEDLE FILTER BLUNT 18X 1/2SAF (NEEDLE) ×2
NEEDLE FILTER BLUNT 18X1 1/2 (NEEDLE) ×1 IMPLANT
PACK CATARACT BRASINGTON (MISCELLANEOUS) ×3 IMPLANT
PACK EYE AFTER SURG (MISCELLANEOUS) ×3 IMPLANT
PACK OPTHALMIC (MISCELLANEOUS) ×3 IMPLANT
RING MALYGIN 7.0 (MISCELLANEOUS) IMPLANT
SUT ETHILON 10-0 CS-B-6CS-B-6 (SUTURE)
SUT VICRYL  9 0 (SUTURE)
SUT VICRYL 9 0 (SUTURE) IMPLANT
SUTURE EHLN 10-0 CS-B-6CS-B-6 (SUTURE) IMPLANT
SYR 3ML LL SCALE MARK (SYRINGE) ×3 IMPLANT
SYR 5ML LL (SYRINGE) ×3 IMPLANT
SYR TB 1ML LUER SLIP (SYRINGE) ×3 IMPLANT
WATER STERILE IRR 250ML POUR (IV SOLUTION) ×3 IMPLANT
WIPE NON LINTING 3.25X3.25 (MISCELLANEOUS) ×3 IMPLANT

## 2017-05-05 NOTE — Anesthesia Preprocedure Evaluation (Signed)
Anesthesia Evaluation  Patient identified by MRN, date of birth, ID band Patient awake    Reviewed: Allergy & Precautions, H&P , NPO status , Patient's Chart, lab work & pertinent test results, reviewed documented beta blocker date and time   Airway Mallampati: II  TM Distance: >3 FB Neck ROM: full    Dental no notable dental hx.    Pulmonary neg pulmonary ROS,    Pulmonary exam normal breath sounds clear to auscultation       Cardiovascular Exercise Tolerance: Good hypertension,  Rhythm:regular Rate:Normal     Neuro/Psych negative neurological ROS  negative psych ROS   GI/Hepatic negative GI ROS, Neg liver ROS,   Endo/Other  Hypothyroidism   Renal/GU negative Renal ROS   H/o prostate ca, BPH, peyronie's disease    Musculoskeletal   Abdominal   Peds  Hematology negative hematology ROS (+) Pernicious anemia   Anesthesia Other Findings   Reproductive/Obstetrics negative OB ROS                             Anesthesia Physical Anesthesia Plan  ASA: II  Anesthesia Plan: MAC   Post-op Pain Management:    Induction:   PONV Risk Score and Plan:   Airway Management Planned:   Additional Equipment:   Intra-op Plan:   Post-operative Plan:   Informed Consent: I have reviewed the patients History and Physical, chart, labs and discussed the procedure including the risks, benefits and alternatives for the proposed anesthesia with the patient or authorized representative who has indicated his/her understanding and acceptance.   Dental Advisory Given  Plan Discussed with: CRNA  Anesthesia Plan Comments:         Anesthesia Quick Evaluation

## 2017-05-05 NOTE — Anesthesia Procedure Notes (Signed)
Procedure Name: MAC Performed by: Selia Wareing Pre-anesthesia Checklist: Patient identified, Emergency Drugs available, Suction available, Timeout performed and Patient being monitored Patient Re-evaluated:Patient Re-evaluated prior to inductionOxygen Delivery Method: Nasal cannula Placement Confirmation: positive ETCO2       

## 2017-05-05 NOTE — H&P (Signed)
The History and Physical notes are on paper, have been signed, and are to be scanned. The patient remains stable and unchanged from the H&P.   Previous H&P reviewed, patient examined, and there are no changes.  Christian Garcia 05/05/2017 8:48 AM   

## 2017-05-05 NOTE — Anesthesia Postprocedure Evaluation (Signed)
Anesthesia Post Note  Patient: Christian Garcia  Procedure(s) Performed: CATARACT EXTRACTION PHACO AND INTRAOCULAR LENS PLACEMENT (IOC) RIGHT (Right )  Patient location during evaluation: PACU Anesthesia Type: MAC Level of consciousness: awake and alert Pain management: pain level controlled Vital Signs Assessment: post-procedure vital signs reviewed and stable Respiratory status: spontaneous breathing, nonlabored ventilation, respiratory function stable and patient connected to nasal cannula oxygen Cardiovascular status: stable and blood pressure returned to baseline Postop Assessment: no apparent nausea or vomiting Anesthetic complications: no    Alisa Graff

## 2017-05-05 NOTE — Op Note (Signed)
LOCATION:  [place]   PREOPERATIVE DIAGNOSIS:    Nuclear sclerotic cataract right eye. H25.11   POSTOPERATIVE DIAGNOSIS:  Nuclear sclerotic cataract right eye.     PROCEDURE:  Phacoemusification with posterior chamber intraocular lens placement of the right eye   LENS:   Implant Name Type Inv. Item Serial No. Manufacturer Lot No. LRB No. Used  LENS IOL DIOP 20.0 - [id number] Intraocular Lens LENS IOL DIOP 20.0 [id number] AMO   Right 1     ULTRASOUND TIME: 15 % of 1 minutes, 31  seconds.  CDE 13.3   SURGEON:  Wyonia Hough, MD   ANESTHESIA:  Topical with tetracaine drops and 2% Xylocaine jelly, augmented with 1% preservative-free intracameral lidocaine.    COMPLICATIONS:  None.   DESCRIPTION OF PROCEDURE:  The patient was identified in the holding room and transported to the operating room and placed in the supine position under the operating microscope.  The right eye was identified as the operative eye and it was prepped and draped in the usual sterile ophthalmic fashion.   A 1 millimeter clear-corneal paracentesis was made at the 12:00 position.  0.5 ml of preservative-free 1% lidocaine was injected into the anterior chamber. The anterior chamber was filled with Viscoat viscoelastic.  A 2.4 millimeter keratome was used to make a near-clear corneal incision at the 9:00 position.  A curvilinear capsulorrhexis was made with a cystotome and capsulorrhexis forceps.  Balanced salt solution was used to hydrodissect and hydrodelineate the nucleus.   Phacoemulsification was then used in stop and chop fashion to remove the lens nucleus and epinucleus.  The remaining cortex was then removed using the irrigation and aspiration handpiece. Provisc was then placed into the capsular bag to distend it for lens placement.  A lens was then injected into the capsular bag.  The remaining viscoelastic was aspirated.   Wounds were hydrated with balanced salt solution.  The anterior  chamber was inflated to a physiologic pressure with balanced salt solution.  No wound leaks were noted. Cefuroxime 0.1 ml of a 10mg /ml solution was injected into the anterior chamber for a dose of 1 mg of intracameral antibiotic at the completion of the case.   Timolol and Brimonidine drops were applied to the eye.  The patient was taken to the recovery room in stable condition without complications of anesthesia or surgery.   Christian Garcia 05/05/2017, 10:06 AM

## 2017-05-05 NOTE — Transfer of Care (Signed)
Immediate Anesthesia Transfer of Care Note  Patient: Christian Garcia  Procedure(s) Performed: CATARACT EXTRACTION PHACO AND INTRAOCULAR LENS PLACEMENT (IOC) RIGHT (Right )  Patient Location: PACU  Anesthesia Type: MAC  Level of Consciousness: awake, alert  and patient cooperative  Airway and Oxygen Therapy: Patient Spontanous Breathing and Patient connected to supplemental oxygen  Post-op Assessment: Post-op Vital signs reviewed, Patient's Cardiovascular Status Stable, Respiratory Function Stable, Patent Airway and No signs of Nausea or vomiting  Post-op Vital Signs: Reviewed and stable  Complications: No apparent anesthesia complications

## 2017-05-06 ENCOUNTER — Encounter: Payer: Self-pay | Admitting: Ophthalmology

## 2017-06-01 DIAGNOSIS — E538 Deficiency of other specified B group vitamins: Secondary | ICD-10-CM | POA: Diagnosis not present

## 2017-06-04 DIAGNOSIS — Z961 Presence of intraocular lens: Secondary | ICD-10-CM | POA: Diagnosis not present

## 2017-06-10 NOTE — Progress Notes (Signed)
10:09 AM   Jolaine Click Oct 05, 1928 [id number]  Referring provider: Kirk Ruths, MD [facility] [place], [place] [address]  Chief Complaint  Patient presents with  . Prostate Cancer    HPI: Patient is an 83 year old Caucasian male with prostate cancer who presents today for biannual follow-up for prostate cancer.  Patient underwent cryoablation in September 2009 for a Gleason's 7 adenocarcinoma of the prostate.  His IPSS score is 0/0.  His previous IPSS score was 0/0.  His recent PSA was 0.64 ng/mL on 12/03/2016.    He has no urinary complaints at this time.  He states he feels well.  He denies fevers, chills, nausea or vomiting.  He also is not having dysuria, suprapubic pain or gross hematuria.  He is not having pain, appetite loss or weight loss.    IPSS    Row Name 06/11/17 0900         International Prostate Symptom Score   How often have you had the sensation of not emptying your bladder?  Not at All     How often have you had to urinate less than every two hours?  Not at All     How often have you found you stopped and started again several times when you urinated?  Not at All     How often have you found it difficult to postpone urination?  Not at All     How often have you had a weak urinary stream?  Not at All     How often have you had to strain to start urination?  Not at All     How many times did you typically get up at night to urinate?  None     Total IPSS Score  0       Quality of Life due to urinary symptoms   If you were to spend the rest of your life with your urinary condition just the way it is now how would you feel about that?  Delighted        Score:  1-7 Mild 8-19 Moderate 20-35 Severe   PMH: Past Medical History:  Diagnosis Date  . BPH (benign prostatic hypertrophy)   . Elevated PSA   . HTN (hypertension)   . Peyronie's disease   . Prostate cancer [facility])     Surgical History: Past Surgical  History:  Procedure Laterality Date  . APPENDECTOMY    . CRYOABLATION  2009   prostate  . FOOT SURGERY  right   ganglion cyst removed    Home Medications:  Allergies as of 06/11/2017      Reactions   Sulfa Antibiotics    Red skin      Medication List        Accurate as of 06/11/17 10:09 AM. Always use your most recent med list.          betamethasone dipropionate 0.05 % cream Commonly known as:  DIPROLENE Apply topically 2 (two) times daily.   clotrimazole-betamethasone cream Commonly known as:  LOTRISONE Apply 1 application topically 2 (two) times daily as needed.   levothyroxine 50 MCG tablet Commonly known as:  SYNTHROID, LEVOTHROID Take by mouth.   mupirocin ointment 2 % Commonly known as:  BACTROBAN Place 1 application into the nose 2 (two) times daily.   VITAMIN B-12 IJ Inject 1,000 mLs as directed.       Allergies:  Allergies  Allergen Reactions  .  Sulfa Antibiotics     Red skin    Family History: Family History  Problem Relation Age of Onset  . Prostate cancer Brother        x 3  . Breast cancer Sister   . Kidney disease Neg Hx   . Kidney cancer Neg Hx   . Bladder Cancer Neg Hx     Social History:  reports that  has never smoked. he has never used smokeless tobacco. He reports that he does not drink alcohol or use drugs.  ROS: UROLOGY Frequent Urination?: No Hard to postpone urination?: No Burning/pain with urination?: No Get up at night to urinate?: No Leakage of urine?: No Urine stream starts and stops?: No Trouble starting stream?: No Do you have to strain to urinate?: No Blood in urine?: No Urinary tract infection?: No Sexually transmitted disease?: No Injury to kidneys or bladder?: No Painful intercourse?: No Weak stream?: No Erection problems?: No Penile pain?: No  Gastrointestinal Nausea?: No Vomiting?: No Indigestion/heartburn?: No Diarrhea?: No Constipation?: No  Constitutional Fever: No Night sweats?:  No Weight loss?: No Fatigue?: No  Skin Skin rash/lesions?: No Itching?: No  Eyes Blurred vision?: No Double vision?: No  Ears/Nose/Throat Sore throat?: No Sinus problems?: No  Hematologic/Lymphatic Swollen glands?: No Easy bruising?: No  Cardiovascular Leg swelling?: No Chest pain?: No  Respiratory Cough?: No Shortness of breath?: No  Endocrine Excessive thirst?: No  Musculoskeletal Back pain?: No Joint pain?: No  Neurological Headaches?: No Dizziness?: No  Psychologic Depression?: No Anxiety?: No  Physical Exam: BP (!) 143/69   Pulse 80   Ht 5\' 10"  (1.778 m)   Wt 182 lb (82.6 kg)   BMI 26.11 kg/m   GU: No CVA tenderness.  No bladder fullness or masses.  Patient with uncircumcised phallus. Foreskin easily retracted  Urethral meatus is patent.  No penile discharge. No penile lesions or rashes. Scrotum without lesions, cysts, rashes and/or edema.  Testicles are located scrotally bilaterally. No masses are appreciated in the testicles. Left and right epididymis are normal. Rectal: Patient with  normal sphincter tone. Anus and perineum without scarring or rashes. No rectal masses are appreciated. Prostate is approximately 30 grams, no nodules are appreciated. Seminal vesicles are normal.   Laboratory Data: PSA history  0.6 ng/mL on 05/16/2013  0.5 ng/mL on 11/15/2013  0.6 ng/mL on 05/31/2014  0.5 ng/mL on 11/30/2014  0.6 ng/mL on 05/24/2015  0.6 ng/mL on 11/22/2015  0.7 ng/mL on 05/25/2016  0.64 ng/mL on 12/03/2016  0.76 ng/mL on 06/11/2017  I have reviewed the labs.     Assessment & Plan:    1. Prostate cancer [facility]):   Patient underwent cryoablation in September 2009 for a Gleason's 7 adenocarcinoma of the prostate.  His current PSA is 0.64 ng/mL on 12/03/2016.  We will continue monitoring every 6 months. He will return for a PSA, IPSS and exam at that time.   Return in about 6 months (around 12/09/2017) for IPSS, PSA and exam.  Zara Council,  [facility]  [facility] [address] [place], [place] [address] [phone]

## 2017-06-11 ENCOUNTER — Encounter: Payer: Self-pay | Admitting: Urology

## 2017-06-11 ENCOUNTER — Other Ambulatory Visit
Admission: RE | Admit: 2017-06-11 | Discharge: 2017-06-11 | Disposition: A | Discharge status: Home / Self Care | Payer: PPO | Source: Ambulatory Visit | Attending: Urology | Admitting: Urology

## 2017-06-11 ENCOUNTER — Other Ambulatory Visit: Payer: Self-pay

## 2017-06-11 ENCOUNTER — Ambulatory Visit (INDEPENDENT_AMBULATORY_CARE_PROVIDER_SITE_OTHER): Payer: PPO | Admitting: Urology

## 2017-06-11 VITALS — BP 143/69 | HR 80 | Ht 70.0 in | Wt 182.0 lb

## 2017-06-11 DIAGNOSIS — C61 Malignant neoplasm of prostate: Secondary | ICD-10-CM | POA: Diagnosis not present

## 2017-06-11 DIAGNOSIS — N401 Enlarged prostate with lower urinary tract symptoms: Secondary | ICD-10-CM

## 2017-06-11 LAB — PSA: PROSTATIC SPECIFIC ANTIGEN: 0.76 ng/mL (ref 0.00–4.00)

## 2017-06-23 ENCOUNTER — Telehealth: Payer: Self-pay | Admitting: Urology

## 2017-06-23 NOTE — Telephone Encounter (Signed)
Will send a letter

## 2017-06-23 NOTE — Telephone Encounter (Signed)
Please let Mr. Ganas know that his PSA is stable at 0.76.  We will see him in 6 months.

## 2017-07-02 DIAGNOSIS — E538 Deficiency of other specified B group vitamins: Secondary | ICD-10-CM | POA: Diagnosis not present

## 2017-08-10 DIAGNOSIS — E538 Deficiency of other specified B group vitamins: Secondary | ICD-10-CM | POA: Diagnosis not present

## 2017-08-13 DIAGNOSIS — J0100 Acute maxillary sinusitis, unspecified: Secondary | ICD-10-CM | POA: Diagnosis not present

## 2017-08-14 DIAGNOSIS — R1110 Vomiting, unspecified: Secondary | ICD-10-CM | POA: Diagnosis not present

## 2017-08-14 DIAGNOSIS — J0100 Acute maxillary sinusitis, unspecified: Secondary | ICD-10-CM | POA: Diagnosis not present

## 2017-08-14 DIAGNOSIS — R110 Nausea: Secondary | ICD-10-CM | POA: Diagnosis not present

## 2017-09-10 DIAGNOSIS — E538 Deficiency of other specified B group vitamins: Secondary | ICD-10-CM | POA: Diagnosis not present

## 2017-09-14 DIAGNOSIS — Z08 Encounter for follow-up examination after completed treatment for malignant neoplasm: Secondary | ICD-10-CM | POA: Diagnosis not present

## 2017-09-14 DIAGNOSIS — L988 Other specified disorders of the skin and subcutaneous tissue: Secondary | ICD-10-CM | POA: Diagnosis not present

## 2017-09-14 DIAGNOSIS — X32XXXA Exposure to sunlight, initial encounter: Secondary | ICD-10-CM | POA: Diagnosis not present

## 2017-09-14 DIAGNOSIS — L570 Actinic keratosis: Secondary | ICD-10-CM | POA: Diagnosis not present

## 2017-09-14 DIAGNOSIS — Z85828 Personal history of other malignant neoplasm of skin: Secondary | ICD-10-CM | POA: Diagnosis not present

## 2017-09-14 DIAGNOSIS — L821 Other seborrheic keratosis: Secondary | ICD-10-CM | POA: Diagnosis not present

## 2017-10-11 DIAGNOSIS — E538 Deficiency of other specified B group vitamins: Secondary | ICD-10-CM | POA: Diagnosis not present

## 2017-10-20 DIAGNOSIS — E039 Hypothyroidism, unspecified: Secondary | ICD-10-CM | POA: Diagnosis not present

## 2017-10-20 DIAGNOSIS — D510 Vitamin B12 deficiency anemia due to intrinsic factor deficiency: Secondary | ICD-10-CM | POA: Diagnosis not present

## 2017-10-20 DIAGNOSIS — E7800 Pure hypercholesterolemia, unspecified: Secondary | ICD-10-CM | POA: Diagnosis not present

## 2017-10-27 DIAGNOSIS — E7800 Pure hypercholesterolemia, unspecified: Secondary | ICD-10-CM | POA: Diagnosis not present

## 2017-10-27 DIAGNOSIS — D510 Vitamin B12 deficiency anemia due to intrinsic factor deficiency: Secondary | ICD-10-CM | POA: Diagnosis not present

## 2017-10-27 DIAGNOSIS — E039 Hypothyroidism, unspecified: Secondary | ICD-10-CM | POA: Diagnosis not present

## 2017-11-09 DIAGNOSIS — J069 Acute upper respiratory infection, unspecified: Secondary | ICD-10-CM | POA: Diagnosis not present

## 2017-11-09 DIAGNOSIS — J019 Acute sinusitis, unspecified: Secondary | ICD-10-CM | POA: Diagnosis not present

## 2017-11-12 DIAGNOSIS — E538 Deficiency of other specified B group vitamins: Secondary | ICD-10-CM | POA: Diagnosis not present

## 2017-11-16 DIAGNOSIS — J0100 Acute maxillary sinusitis, unspecified: Secondary | ICD-10-CM | POA: Diagnosis not present

## 2017-12-10 ENCOUNTER — Ambulatory Visit: Payer: PPO | Admitting: Urology

## 2017-12-15 DIAGNOSIS — D510 Vitamin B12 deficiency anemia due to intrinsic factor deficiency: Secondary | ICD-10-CM | POA: Diagnosis not present

## 2018-01-06 ENCOUNTER — Other Ambulatory Visit: Payer: Self-pay

## 2018-01-06 DIAGNOSIS — C61 Malignant neoplasm of prostate: Secondary | ICD-10-CM

## 2018-01-06 NOTE — Progress Notes (Addendum)
11:36 AM   Christian Garcia Nov 08, 1928 [id number]  Referring provider: Kirk Ruths, MD [place] [facility] [place], [place] [address]  Chief Complaint  Patient presents with  . Prostate Cancer    HPI: Patient is an 84 year old Caucasian male with prostate cancer who presents today for follow-up for prostate cancer.  Patient underwent cryoablation in September 2009 for a Gleason's 7 adenocarcinoma of the prostate.  His previous IPSS score was 0/0.  His recent PSA was 0.50 ng/mL in 01/2018.    He has no urinary complaints at this time.  He states he feels well.  He denies fevers, chills, nausea or vomiting.  He also is not having dysuria, suprapubic pain or gross hematuria.  He is not having pain, appetite loss or weight loss.    PMH: Past Medical History:  Diagnosis Date  . BPH (benign prostatic hypertrophy)   . Elevated PSA   . HTN (hypertension)   . Peyronie's disease   . Prostate cancer [facility])     Surgical History: Past Surgical History:  Procedure Laterality Date  . APPENDECTOMY    . CATARACT EXTRACTION W/PHACO Left 03/24/2017   Procedure: CATARACT EXTRACTION PHACO AND INTRAOCULAR LENS PLACEMENT ([place]) LEFT;  Surgeon: Leandrew Koyanagi, MD;  Location: [place];  Service: Ophthalmology;  Laterality: Left;  . CATARACT EXTRACTION W/PHACO Right 05/05/2017   Procedure: CATARACT EXTRACTION PHACO AND INTRAOCULAR LENS PLACEMENT ([place]) RIGHT;  Surgeon: Leandrew Koyanagi, MD;  Location: [place];  Service: Ophthalmology;  Laterality: Right;  . CRYOABLATION  2009   prostate  . FOOT SURGERY  right   ganglion cyst removed    Home Medications:  Allergies as of 01/07/2018      Reactions   Sulfa Antibiotics    Red skin      Medication List        Accurate as of 01/07/18 11:36 AM. Always use your most recent med list.          azelastine 0.1 % nasal spray Commonly known as:  ASTELIN   betamethasone dipropionate 0.05  % cream Commonly known as:  DIPROLENE Apply topically 2 (two) times daily.   clotrimazole-betamethasone cream Commonly known as:  LOTRISONE Apply 1 application topically 2 (two) times daily as needed.   Cyanocobalamin 1000 MCG/ML Kit Inject 1,000 mLs as directed.   fluticasone 50 MCG/ACT nasal spray Commonly known as:  FLONASE Place into the nose.   levothyroxine 50 MCG tablet Commonly known as:  SYNTHROID, LEVOTHROID Take by mouth.   mupirocin ointment 2 % Commonly known as:  BACTROBAN Place 1 application into the nose 2 (two) times daily.       Allergies:  Allergies  Allergen Reactions  . Sulfa Antibiotics     Red skin    Family History: Family History  Problem Relation Age of Onset  . Prostate cancer Brother        x 3  . Breast cancer Sister   . Kidney disease Neg Hx   . Kidney cancer Neg Hx   . Bladder Cancer Neg Hx     Social History:  reports that he has never smoked. He has never used smokeless tobacco. He reports that he does not drink alcohol or use drugs.  ROS: UROLOGY Frequent Urination?: No Hard to postpone urination?: No Burning/pain with urination?: No Get up at night to urinate?: No Leakage of urine?: No Urine stream starts and stops?: No Trouble starting stream?: No Do you  have to strain to urinate?: No Blood in urine?: No Urinary tract infection?: No Sexually transmitted disease?: No Injury to kidneys or bladder?: No Painful intercourse?: No Weak stream?: No Erection problems?: No Penile pain?: No  Gastrointestinal Nausea?: No Vomiting?: No Indigestion/heartburn?: No Diarrhea?: No Constipation?: No  Constitutional Fever: No Night sweats?: No Weight loss?: No Fatigue?: No  Skin Skin rash/lesions?: No Itching?: No  Eyes Blurred vision?: No Double vision?: No  Ears/Nose/Throat Sore throat?: No Sinus problems?: No  Hematologic/Lymphatic Swollen glands?: No Easy bruising?: No  Cardiovascular Leg swelling?:  No Chest pain?: No  Respiratory Cough?: No Shortness of breath?: No  Endocrine Excessive thirst?: No  Musculoskeletal Back pain?: No Joint pain?: No  Neurological Headaches?: No Dizziness?: No  Psychologic Depression?: No Anxiety?: No  Physical Exam: BP 134/66 (BP Location: Left Arm, Patient Position: Sitting, Cuff Size: Large)   Pulse 63   Ht '5\' 10"'$  (1.778 m)   Wt 182 lb (82.6 kg)   BMI 26.11 kg/m   Constitutional: Well nourished. Alert and oriented, No acute distress. HEENT: [place] AT, moist mucus membranes. Trachea midline, no masses. Cardiovascular: No clubbing, cyanosis, or edema. Respiratory: Normal respiratory effort, no increased work of breathing. GI: Abdomen is soft, non tender, non distended, no abdominal masses. Liver and spleen not palpable.  No hernias appreciated.  Stool sample for occult testing is not indicated.   GU: No CVA tenderness.  No bladder fullness or masses.  Patient with uncircumcised phallus.   Foreskin easily retracted   Urethral meatus is patent.  No penile discharge. No penile lesions or rashes. Scrotum without lesions, cysts, rashes and/or edema.  Testicles are located scrotally bilaterally. No masses are appreciated in the testicles. Left and right epididymis are normal. Rectal: Patient with  normal sphincter tone. Anus and perineum without scarring or rashes. No rectal masses are appreciated. Prostate is approximately 45 grams, irregular, no nodules are appreciated. Seminal vesicles are normal. Skin: No rashes, bruises or suspicious lesions. Lymph: No cervical or inguinal adenopathy. Neurologic: Grossly intact, no focal deficits, moving all 4 extremities. Psychiatric: Normal mood and affect.    Laboratory Data: PSA history  0.6 ng/mL on 05/16/2013  0.5 ng/mL on 11/15/2013  0.6 ng/mL on 05/31/2014  0.5 ng/mL on 11/30/2014  0.6 ng/mL on 05/24/2015  0.6 ng/mL on 11/22/2015  0.7 ng/mL on 05/25/2016  0.64 ng/mL on 12/03/2016  0.76 ng/mL  on 06/11/2017  0.50 ng/mL on 01/07/2018  I have reviewed the labs.     Assessment & Plan:    1. Prostate cancer [facility]):   Patient underwent cryoablation in September 2009 for a Gleason's 7 adenocarcinoma of the prostate.  His current PSA is 0.50 ng/mL on 01/07/2018.  We will continue monitoring every 6 months. He will return for a PSA, IPSS and exam at that time.   Return in about 6 months (around 07/09/2018) for PSA and exam.  Zara Council, [facility]  [place], [place] [address] [phone]

## 2018-01-07 ENCOUNTER — Ambulatory Visit (INDEPENDENT_AMBULATORY_CARE_PROVIDER_SITE_OTHER): Payer: PPO | Admitting: Urology

## 2018-01-07 ENCOUNTER — Encounter: Payer: Self-pay | Admitting: Urology

## 2018-01-07 ENCOUNTER — Other Ambulatory Visit
Admission: RE | Admit: 2018-01-07 | Discharge: 2018-01-07 | Disposition: A | Discharge status: Home / Self Care | Payer: PPO | Source: Ambulatory Visit | Attending: Urology | Admitting: Urology

## 2018-01-07 VITALS — BP 134/66 | HR 63 | Ht 70.0 in | Wt 182.0 lb

## 2018-01-07 DIAGNOSIS — C61 Malignant neoplasm of prostate: Secondary | ICD-10-CM | POA: Diagnosis not present

## 2018-01-07 LAB — PSA: PROSTATIC SPECIFIC ANTIGEN: 0.5 ng/mL (ref 0.00–4.00)

## 2018-01-10 ENCOUNTER — Telehealth: Payer: Self-pay

## 2018-01-10 NOTE — Telephone Encounter (Signed)
-----   Message from Nori Riis, PA-C sent at 01/08/2018 11:17 AM EDT ----- Please let Christian Garcia's PSA is stable at 0.50.  We will see him in December.   PSA to be drawn before his next appointment.

## 2018-01-10 NOTE — Telephone Encounter (Signed)
lmom for pt to call office

## 2018-01-17 DIAGNOSIS — D510 Vitamin B12 deficiency anemia due to intrinsic factor deficiency: Secondary | ICD-10-CM | POA: Diagnosis not present

## 2018-02-17 DIAGNOSIS — D510 Vitamin B12 deficiency anemia due to intrinsic factor deficiency: Secondary | ICD-10-CM | POA: Diagnosis not present

## 2018-03-21 DIAGNOSIS — D510 Vitamin B12 deficiency anemia due to intrinsic factor deficiency: Secondary | ICD-10-CM | POA: Diagnosis not present

## 2018-03-21 DIAGNOSIS — E538 Deficiency of other specified B group vitamins: Secondary | ICD-10-CM | POA: Diagnosis not present

## 2018-03-23 DIAGNOSIS — L120 Bullous pemphigoid: Secondary | ICD-10-CM | POA: Diagnosis not present

## 2018-03-23 DIAGNOSIS — Z08 Encounter for follow-up examination after completed treatment for malignant neoplasm: Secondary | ICD-10-CM | POA: Diagnosis not present

## 2018-03-23 DIAGNOSIS — Z85828 Personal history of other malignant neoplasm of skin: Secondary | ICD-10-CM | POA: Diagnosis not present

## 2018-03-23 DIAGNOSIS — X32XXXA Exposure to sunlight, initial encounter: Secondary | ICD-10-CM | POA: Diagnosis not present

## 2018-03-23 DIAGNOSIS — L570 Actinic keratosis: Secondary | ICD-10-CM | POA: Diagnosis not present

## 2018-03-23 DIAGNOSIS — B078 Other viral warts: Secondary | ICD-10-CM | POA: Diagnosis not present

## 2018-03-23 DIAGNOSIS — D485 Neoplasm of uncertain behavior of skin: Secondary | ICD-10-CM | POA: Diagnosis not present

## 2018-03-23 DIAGNOSIS — L538 Other specified erythematous conditions: Secondary | ICD-10-CM | POA: Diagnosis not present

## 2018-03-23 DIAGNOSIS — L820 Inflamed seborrheic keratosis: Secondary | ICD-10-CM | POA: Diagnosis not present

## 2018-04-07 DIAGNOSIS — B078 Other viral warts: Secondary | ICD-10-CM | POA: Diagnosis not present

## 2018-04-07 DIAGNOSIS — R208 Other disturbances of skin sensation: Secondary | ICD-10-CM | POA: Diagnosis not present

## 2018-04-07 DIAGNOSIS — L538 Other specified erythematous conditions: Secondary | ICD-10-CM | POA: Diagnosis not present

## 2018-04-21 DIAGNOSIS — E538 Deficiency of other specified B group vitamins: Secondary | ICD-10-CM | POA: Diagnosis not present

## 2018-04-21 DIAGNOSIS — D510 Vitamin B12 deficiency anemia due to intrinsic factor deficiency: Secondary | ICD-10-CM | POA: Diagnosis not present

## 2018-04-26 DIAGNOSIS — E7800 Pure hypercholesterolemia, unspecified: Secondary | ICD-10-CM | POA: Diagnosis not present

## 2018-04-26 DIAGNOSIS — E039 Hypothyroidism, unspecified: Secondary | ICD-10-CM | POA: Diagnosis not present

## 2018-04-26 DIAGNOSIS — D510 Vitamin B12 deficiency anemia due to intrinsic factor deficiency: Secondary | ICD-10-CM | POA: Diagnosis not present

## 2018-05-03 DIAGNOSIS — Z0000 Encounter for general adult medical examination without abnormal findings: Secondary | ICD-10-CM | POA: Diagnosis not present

## 2018-05-03 DIAGNOSIS — E7800 Pure hypercholesterolemia, unspecified: Secondary | ICD-10-CM | POA: Diagnosis not present

## 2018-05-03 DIAGNOSIS — D510 Vitamin B12 deficiency anemia due to intrinsic factor deficiency: Secondary | ICD-10-CM | POA: Diagnosis not present

## 2018-05-03 DIAGNOSIS — E039 Hypothyroidism, unspecified: Secondary | ICD-10-CM | POA: Diagnosis not present

## 2018-05-03 DIAGNOSIS — Z23 Encounter for immunization: Secondary | ICD-10-CM | POA: Diagnosis not present

## 2018-05-23 DIAGNOSIS — E538 Deficiency of other specified B group vitamins: Secondary | ICD-10-CM | POA: Diagnosis not present

## 2018-05-23 DIAGNOSIS — D510 Vitamin B12 deficiency anemia due to intrinsic factor deficiency: Secondary | ICD-10-CM | POA: Diagnosis not present

## 2018-06-23 DIAGNOSIS — D510 Vitamin B12 deficiency anemia due to intrinsic factor deficiency: Secondary | ICD-10-CM | POA: Diagnosis not present

## 2018-07-15 ENCOUNTER — Ambulatory Visit: Payer: PPO | Admitting: Urology

## 2018-07-25 ENCOUNTER — Ambulatory Visit: Payer: PPO | Admitting: Urology

## 2018-07-25 DIAGNOSIS — D510 Vitamin B12 deficiency anemia due to intrinsic factor deficiency: Secondary | ICD-10-CM | POA: Diagnosis not present

## 2018-07-25 DIAGNOSIS — E538 Deficiency of other specified B group vitamins: Secondary | ICD-10-CM | POA: Diagnosis not present

## 2018-08-07 NOTE — Progress Notes (Signed)
10:53 AM   Christian Garcia [birth year] [id number]  Referring provider: Kirk Ruths, MD [facility] [place], [place] [address]  Chief Complaint  Patient presents with  . Follow-up    PSA and exam    HPI: Patient is an 85 year old Caucasian male with prostate cancer who presents today for a 6 month follow up.    Patient underwent cryoablation in September 2009 for a Gleason's 7 adenocarcinoma of the prostate.   His recent PSA was 0.50 ng/mL in 01/2018.    He has no complaints at this time.  Patient denies any gross hematuria, dysuria or suprapubic/flank pain.  Patient denies any fevers, chills, nausea or vomiting.  He has a good appetite, no weight loss and good activity.    PMH: Past Medical History:  Diagnosis Date  . BPH (benign prostatic hypertrophy)   . Elevated PSA   . HTN (hypertension)   . Peyronie's disease   . Prostate cancer [facility])     Surgical History: Past Surgical History:  Procedure Laterality Date  . APPENDECTOMY    . CATARACT EXTRACTION W/PHACO Left 03/24/2017   Procedure: CATARACT EXTRACTION PHACO AND INTRAOCULAR LENS PLACEMENT ([place]) LEFT;  Surgeon: Leandrew Koyanagi, MD;  Location: [place];  Service: Ophthalmology;  Laterality: Left;  . CATARACT EXTRACTION W/PHACO Right 05/05/2017   Procedure: CATARACT EXTRACTION PHACO AND INTRAOCULAR LENS PLACEMENT ([place]) RIGHT;  Surgeon: Leandrew Koyanagi, MD;  Location: [place];  Service: Ophthalmology;  Laterality: Right;  . CRYOABLATION  2009   prostate  . FOOT SURGERY  right   ganglion cyst removed    Home Medications:  Allergies as of 08/08/2018      Reactions   Sulfa Antibiotics    Red skin      Medication List       Accurate as of August 08, 2018 10:53 AM. Always use your most recent med list.        betamethasone dipropionate 0.05 % cream Commonly known as:  DIPROLENE Apply topically 2 (two) times daily.     clotrimazole-betamethasone cream Commonly known as:  LOTRISONE Apply 1 application topically 2 (two) times daily as needed.   Cyanocobalamin 1000 MCG/ML Kit Inject 1,000 mLs as directed.   levothyroxine 50 MCG tablet Commonly known as:  SYNTHROID, LEVOTHROID Take by mouth.       Allergies:  Allergies  Allergen Reactions  . Sulfa Antibiotics     Red skin    Family History: Family History  Problem Relation Age of Onset  . Prostate cancer Brother        x 3  . Breast cancer Sister   . Kidney disease Neg Hx   . Kidney cancer Neg Hx   . Bladder Cancer Neg Hx     Social History:  reports that he has never smoked. He has never used smokeless tobacco. He reports that he does not drink alcohol or use drugs.  ROS: UROLOGY Frequent Urination?: No Hard to postpone urination?: No Burning/pain with urination?: No Get up at night to urinate?: No Leakage of urine?: No Urine stream starts and stops?: No Trouble starting stream?: No Do you have to strain to urinate?: No Blood in urine?: No Urinary tract infection?: No Sexually transmitted disease?: No Injury to kidneys or bladder?: No Painful intercourse?: No Weak stream?: No Erection problems?: No Penile pain?: No  Gastrointestinal Nausea?: No Vomiting?: No Indigestion/heartburn?: No Diarrhea?: No Constipation?: No  Constitutional Fever: No Night sweats?:  No Weight loss?: No Fatigue?: No  Skin Skin rash/lesions?: No Itching?: No  Eyes Blurred vision?: No Double vision?: No  Ears/Nose/Throat Sore throat?: No Sinus problems?: No  Hematologic/Lymphatic Swollen glands?: No Easy bruising?: No  Cardiovascular Leg swelling?: No Chest pain?: No  Respiratory Cough?: No Shortness of breath?: No  Endocrine Excessive thirst?: No  Musculoskeletal Back pain?: No Joint pain?: No  Neurological Headaches?: No Dizziness?: No  Psychologic Depression?: No Anxiety?: No  Physical Exam: BP (!)  150/71   Pulse 70   Temp (!) 96.1 F (35.6 C) (Other (Comment))   Ht '5\' 10"'$  (1.778 m)   Wt 188 lb 8 oz (85.5 kg)   BMI 27.05 kg/m   Constitutional:  Well nourished. Alert and oriented, No acute distress. HEENT: [place] AT, moist mucus membranes.  Trachea midline, no masses. Cardiovascular: No clubbing, cyanosis, or edema. Respiratory: Normal respiratory effort, no increased work of breathing. GI: Abdomen is soft, non tender, non distended, no abdominal masses. Liver and spleen not palpable.  No hernias appreciated.  Stool sample for occult testing is not indicated.   GU: No CVA tenderness.  No bladder fullness or masses.  Patient with uncircumcised phallus. Foreskin easily retracted  Urethral meatus is patent.  No penile discharge. No penile lesions or rashes. Scrotum without lesions, cysts, rashes and/or edema.  Testicles are located scrotally bilaterally. No masses are appreciated in the testicles. Left and right epididymis are normal. Rectal: Patient with  normal sphincter tone. Anus and perineum without scarring or rashes. No rectal masses are appreciated. Prostate is approximately 50 grams, could only palpate the apex, no nodules are appreciated. Skin: No rashes, bruises or suspicious lesions. Lymph: No cervical or inguinal adenopathy. Neurologic: Grossly intact, no focal deficits, moving all 4 extremities. Psychiatric: Normal mood and affect.  Laboratory Data: PSA history  0.6 ng/mL on 05/16/2013  0.5 ng/mL on 11/15/2013  0.6 ng/mL on 05/31/2014  0.5 ng/mL on 11/30/2014  0.6 ng/mL on 05/24/2015  0.6 ng/mL on 11/22/2015  0.7 ng/mL on 05/25/2016  0.64 ng/mL on 12/03/2016  0.76 ng/mL on 06/11/2017  0.50 ng/mL on 01/07/2018  I have reviewed the labs.     Assessment & Plan:    1. Prostate cancer [facility]):   Patient underwent cryoablation in September 2009 for a Gleason's 7 adenocarcinoma of the prostate.  His current PSA is 0.50 ng/mL on 01/07/2018.  We will continue monitoring every  6 months. He will return for a PSA, IPSS and exam at that time - if PSA remains stable    Return in about 6 months (around 02/06/2019) for IPSS, PSA and exam.  Zara Council, [facility]  [place] [place], [place] [address] [phone]

## 2018-08-08 ENCOUNTER — Encounter: Payer: Self-pay | Admitting: Urology

## 2018-08-08 ENCOUNTER — Ambulatory Visit: Payer: PPO | Admitting: Urology

## 2018-08-08 ENCOUNTER — Other Ambulatory Visit: Payer: Self-pay

## 2018-08-08 ENCOUNTER — Other Ambulatory Visit
Admission: RE | Admit: 2018-08-08 | Discharge: 2018-08-08 | Disposition: A | Discharge status: Home / Self Care | Payer: PPO | Attending: Urology | Admitting: Urology

## 2018-08-08 VITALS — BP 150/71 | HR 70 | Temp 96.1°F | Ht 70.0 in | Wt 188.5 lb

## 2018-08-08 DIAGNOSIS — C61 Malignant neoplasm of prostate: Secondary | ICD-10-CM

## 2018-08-08 DIAGNOSIS — N401 Enlarged prostate with lower urinary tract symptoms: Secondary | ICD-10-CM | POA: Insufficient documentation

## 2018-08-08 LAB — PSA: PROSTATIC SPECIFIC ANTIGEN: 0.61 ng/mL (ref 0.00–4.00)

## 2018-08-09 ENCOUNTER — Other Ambulatory Visit: Payer: Self-pay | Admitting: Urology

## 2018-08-09 DIAGNOSIS — C61 Malignant neoplasm of prostate: Secondary | ICD-10-CM

## 2018-08-09 NOTE — Progress Notes (Signed)
PSA order in for 02/2019

## 2018-08-25 DIAGNOSIS — D510 Vitamin B12 deficiency anemia due to intrinsic factor deficiency: Secondary | ICD-10-CM | POA: Diagnosis not present

## 2018-09-26 DIAGNOSIS — E539 Vitamin B deficiency, unspecified: Secondary | ICD-10-CM | POA: Diagnosis not present

## 2018-09-26 DIAGNOSIS — D510 Vitamin B12 deficiency anemia due to intrinsic factor deficiency: Secondary | ICD-10-CM | POA: Diagnosis not present

## 2018-10-27 DIAGNOSIS — D510 Vitamin B12 deficiency anemia due to intrinsic factor deficiency: Secondary | ICD-10-CM | POA: Diagnosis not present

## 2018-10-27 DIAGNOSIS — E7800 Pure hypercholesterolemia, unspecified: Secondary | ICD-10-CM | POA: Diagnosis not present

## 2018-10-27 DIAGNOSIS — E039 Hypothyroidism, unspecified: Secondary | ICD-10-CM | POA: Diagnosis not present

## 2018-11-03 DIAGNOSIS — D510 Vitamin B12 deficiency anemia due to intrinsic factor deficiency: Secondary | ICD-10-CM | POA: Diagnosis not present

## 2018-11-03 DIAGNOSIS — E7800 Pure hypercholesterolemia, unspecified: Secondary | ICD-10-CM | POA: Diagnosis not present

## 2018-11-03 DIAGNOSIS — E039 Hypothyroidism, unspecified: Secondary | ICD-10-CM | POA: Diagnosis not present

## 2018-11-03 DIAGNOSIS — M19012 Primary osteoarthritis, left shoulder: Secondary | ICD-10-CM | POA: Diagnosis not present

## 2018-11-03 DIAGNOSIS — E539 Vitamin B deficiency, unspecified: Secondary | ICD-10-CM | POA: Diagnosis not present

## 2018-11-14 DIAGNOSIS — I2510 Atherosclerotic heart disease of native coronary artery without angina pectoris: Secondary | ICD-10-CM | POA: Diagnosis not present

## 2018-11-14 DIAGNOSIS — E782 Mixed hyperlipidemia: Secondary | ICD-10-CM | POA: Diagnosis not present

## 2018-11-14 DIAGNOSIS — S90934A Unspecified superficial injury of right lesser toe(s), initial encounter: Secondary | ICD-10-CM | POA: Diagnosis not present

## 2018-11-14 DIAGNOSIS — S91201A Unspecified open wound of right great toe with damage to nail, initial encounter: Secondary | ICD-10-CM | POA: Diagnosis not present

## 2018-11-14 DIAGNOSIS — I10 Essential (primary) hypertension: Secondary | ICD-10-CM | POA: Diagnosis not present

## 2018-11-14 DIAGNOSIS — R21 Rash and other nonspecific skin eruption: Secondary | ICD-10-CM | POA: Diagnosis not present

## 2018-11-14 DIAGNOSIS — I2584 Coronary atherosclerosis due to calcified coronary lesion: Secondary | ICD-10-CM | POA: Diagnosis not present

## 2018-11-14 DIAGNOSIS — R300 Dysuria: Secondary | ICD-10-CM | POA: Diagnosis not present

## 2018-11-16 DIAGNOSIS — B356 Tinea cruris: Secondary | ICD-10-CM | POA: Diagnosis not present

## 2018-11-16 DIAGNOSIS — L739 Follicular disorder, unspecified: Secondary | ICD-10-CM | POA: Diagnosis not present

## 2018-11-16 DIAGNOSIS — N481 Balanitis: Secondary | ICD-10-CM | POA: Diagnosis not present

## 2018-11-21 DIAGNOSIS — B356 Tinea cruris: Secondary | ICD-10-CM | POA: Diagnosis not present

## 2018-11-21 DIAGNOSIS — R21 Rash and other nonspecific skin eruption: Secondary | ICD-10-CM | POA: Diagnosis not present

## 2018-11-23 DIAGNOSIS — R233 Spontaneous ecchymoses: Secondary | ICD-10-CM | POA: Diagnosis not present

## 2018-11-23 DIAGNOSIS — L309 Dermatitis, unspecified: Secondary | ICD-10-CM | POA: Diagnosis not present

## 2018-11-25 DIAGNOSIS — L560 Drug phototoxic response: Secondary | ICD-10-CM | POA: Diagnosis not present

## 2018-12-05 DIAGNOSIS — E539 Vitamin B deficiency, unspecified: Secondary | ICD-10-CM | POA: Diagnosis not present

## 2018-12-05 DIAGNOSIS — D510 Vitamin B12 deficiency anemia due to intrinsic factor deficiency: Secondary | ICD-10-CM | POA: Diagnosis not present

## 2019-01-05 DIAGNOSIS — D510 Vitamin B12 deficiency anemia due to intrinsic factor deficiency: Secondary | ICD-10-CM | POA: Diagnosis not present

## 2019-01-10 DIAGNOSIS — B353 Tinea pedis: Secondary | ICD-10-CM | POA: Diagnosis not present

## 2019-01-10 DIAGNOSIS — Z08 Encounter for follow-up examination after completed treatment for malignant neoplasm: Secondary | ICD-10-CM | POA: Diagnosis not present

## 2019-01-10 DIAGNOSIS — Z85828 Personal history of other malignant neoplasm of skin: Secondary | ICD-10-CM | POA: Diagnosis not present

## 2019-01-10 DIAGNOSIS — L570 Actinic keratosis: Secondary | ICD-10-CM | POA: Diagnosis not present

## 2019-02-02 ENCOUNTER — Other Ambulatory Visit: Payer: Self-pay

## 2019-02-02 DIAGNOSIS — C61 Malignant neoplasm of prostate: Secondary | ICD-10-CM

## 2019-02-05 NOTE — Progress Notes (Signed)
10:57 AM   Christian Garcia 1929/06/13 [id number]  Referring provider: Kirk Ruths, MD [place] [facility] [place],  [place] [address]  Chief Complaint  Patient presents with  . Prostate Cancer    6 month    HPI: Patient is an 85 year old Caucasian male with prostate cancer who presents today for a 6 month follow up.    Patient underwent cryoablation in September 2009 for a Gleason's 7 adenocarcinoma of the prostate.   His recent PSA was 0.50 ng/mL in 01/2018.    Patient denies any gross hematuria, dysuria or suprapubic/flank pain.  Patient denies any fevers, chills, nausea or vomiting.   No complaints at this time.   PMH: Past Medical History:  Diagnosis Date  . BPH (benign prostatic hypertrophy)   . Elevated PSA   . HTN (hypertension)   . Peyronie's disease   . Prostate cancer [facility])     Surgical History: Past Surgical History:  Procedure Laterality Date  . APPENDECTOMY    . CATARACT EXTRACTION W/PHACO Left 03/24/2017   Procedure: CATARACT EXTRACTION PHACO AND INTRAOCULAR LENS PLACEMENT ([place]) LEFT;  Surgeon: Leandrew Koyanagi, MD;  Location: [place];  Service: Ophthalmology;  Laterality: Left;  . CATARACT EXTRACTION W/PHACO Right 05/05/2017   Procedure: CATARACT EXTRACTION PHACO AND INTRAOCULAR LENS PLACEMENT ([place]) RIGHT;  Surgeon: Leandrew Koyanagi, MD;  Location: [place];  Service: Ophthalmology;  Laterality: Right;  . CRYOABLATION  2009   prostate  . FOOT SURGERY  right   ganglion cyst removed    Home Medications:  Allergies as of 02/06/2019      Reactions   Sulfa Antibiotics    Red skin      Medication List       Accurate as of February 06, 2019 10:57 AM. If you have any questions, ask your nurse or doctor.        betamethasone dipropionate 0.05 % cream Commonly known as: DIPROLENE Apply topically 2 (two) times daily.   clotrimazole-betamethasone cream Commonly known as: LOTRISONE Apply 1  application topically 2 (two) times daily as needed.   Cyanocobalamin 1000 MCG/ML Kit Inject 1,000 mLs as directed.   levothyroxine 50 MCG tablet Commonly known as: SYNTHROID Take by mouth.       Allergies:  Allergies  Allergen Reactions  . Sulfa Antibiotics     Red skin    Family History: Family History  Problem Relation Age of Onset  . Prostate cancer Brother        x 3  . Breast cancer Sister   . Kidney disease Neg Hx   . Kidney cancer Neg Hx   . Bladder Cancer Neg Hx     Social History:  reports that he has never smoked. He has never used smokeless tobacco. He reports that he does not drink alcohol or use drugs.  ROS: UROLOGY Frequent Urination?: No Hard to postpone urination?: No Burning/pain with urination?: No Get up at night to urinate?: No Leakage of urine?: No Urine stream starts and stops?: No Trouble starting stream?: No Do you have to strain to urinate?: No Blood in urine?: No Urinary tract infection?: No Sexually transmitted disease?: No Injury to kidneys or bladder?: No Painful intercourse?: No Weak stream?: No Erection problems?: No Penile pain?: No  Gastrointestinal Nausea?: No Vomiting?: No Indigestion/heartburn?: No Diarrhea?: No Constipation?: No  Constitutional Fever: No Night sweats?: No Weight loss?: No Fatigue?: No  Skin Skin rash/lesions?: No Itching?: No  Eyes  Blurred vision?: No Double vision?: No  Ears/Nose/Throat Sore throat?: No Sinus problems?: No  Hematologic/Lymphatic Swollen glands?: No Easy bruising?: No  Cardiovascular Leg swelling?: No Chest pain?: No  Respiratory Cough?: No Shortness of breath?: No  Endocrine Excessive thirst?: No  Musculoskeletal Back pain?: No Joint pain?: No  Neurological Headaches?: No Dizziness?: No  Psychologic Depression?: No Anxiety?: No  Physical Exam: BP 134/69   Pulse 66   Ht _0  (1.778 m)   Wt 182 lb (82.6 kg)   BMI 26.11 kg/m    Constitutional:  Well nourished. Alert and oriented, No acute distress. HEENT: [place] AT, moist mucus membranes.  Trachea midline, no masses. Cardiovascular: No clubbing, cyanosis, or edema. Respiratory: Normal respiratory effort, no increased work of breathing. GI: Abdomen is soft, non tender, non distended, no abdominal masses. Liver and spleen not palpable.  No hernias appreciated.  Stool sample for occult testing is not indicated.   GU: No CVA tenderness.  No bladder fullness or masses.  Patient with uncircumcised phallus.  Foreskin easily retracted.    Urethral meatus is patent.  No penile discharge. No penile lesions or rashes. Scrotum without lesions, cysts, rashes and/or edema.  Testicles are located scrotally bilaterally. No masses are appreciated in the testicles. Left and right epididymis are normal. Rectal: Patient with  normal sphincter tone. Anus and perineum without scarring or rashes. No rectal masses are appreciated. Prostate is approximately 45 grams, no nodules are appreciated.  Skin: No rashes, bruises or suspicious lesions. Lymph: No inguinal adenopathy. Neurologic: Grossly intact, no focal deficits, moving all 4 extremities. Psychiatric: Normal mood and affect.  Laboratory Data: PSA history  0.6 ng/mL on 05/16/2013  0.5 ng/mL on 11/15/2013  0.6 ng/mL on 05/31/2014  0.5 ng/mL on 11/30/2014  0.6 ng/mL on 05/24/2015  0.6 ng/mL on 11/22/2015  0.7 ng/mL on 05/25/2016  0.64 ng/mL on 12/03/2016  0.76 ng/mL on 06/11/2017  0.50 ng/mL on 01/07/2018  0.61 ng/mL on 08/08/2018 I have reviewed the labs.     Assessment & Plan:    1. Prostate cancer [facility]):   Patient underwent cryoablation in September 2009 for a Gleason's 7 adenocarcinoma of the prostate.  His current PSA is 0.50 ng/mL on 01/07/2018.  We will continue monitoring every 6 months. He will return for a PSA, IPSS and exam at that time - if PSA remains stable    Return in about 6 months (around 08/09/2019) for PSA and  exam.  Zara Council, [facility]  [place],  [address] [phone]

## 2019-02-06 ENCOUNTER — Ambulatory Visit: Payer: PPO | Admitting: Urology

## 2019-02-06 ENCOUNTER — Other Ambulatory Visit: Payer: Self-pay

## 2019-02-06 ENCOUNTER — Other Ambulatory Visit
Admission: RE | Admit: 2019-02-06 | Discharge: 2019-02-06 | Disposition: A | Discharge status: Home / Self Care | Payer: PPO | Attending: Urology | Admitting: Urology

## 2019-02-06 ENCOUNTER — Encounter: Payer: Self-pay | Admitting: Urology

## 2019-02-06 VITALS — BP 134/69 | HR 66 | Ht 70.0 in | Wt 182.0 lb

## 2019-02-06 DIAGNOSIS — C61 Malignant neoplasm of prostate: Secondary | ICD-10-CM

## 2019-02-06 LAB — PSA: Prostatic Specific Antigen: 0.6 ng/mL (ref 0.00–4.00)

## 2019-02-06 NOTE — Addendum Note (Signed)
Addended by: Elmo Putt on: 02/06/2019 10:14 AM   Modules accepted: Orders

## 2019-02-07 ENCOUNTER — Telehealth: Payer: Self-pay

## 2019-02-07 DIAGNOSIS — D510 Vitamin B12 deficiency anemia due to intrinsic factor deficiency: Secondary | ICD-10-CM | POA: Diagnosis not present

## 2019-02-07 NOTE — Telephone Encounter (Signed)
Left message for patient to return call for results.

## 2019-02-07 NOTE — Telephone Encounter (Signed)
-----   Message from Nori Riis, PA-C sent at 02/07/2019  7:25 AM EDT ----- Please let Mr. Lansdowne know that his PSA is stable and we will see him in January.

## 2019-02-07 NOTE — Telephone Encounter (Signed)
Patient returned call and results were provided to him.

## 2019-03-10 DIAGNOSIS — D510 Vitamin B12 deficiency anemia due to intrinsic factor deficiency: Secondary | ICD-10-CM | POA: Diagnosis not present

## 2019-03-10 DIAGNOSIS — E538 Deficiency of other specified B group vitamins: Secondary | ICD-10-CM | POA: Diagnosis not present

## 2019-04-12 DIAGNOSIS — D510 Vitamin B12 deficiency anemia due to intrinsic factor deficiency: Secondary | ICD-10-CM | POA: Diagnosis not present

## 2019-05-03 DIAGNOSIS — D510 Vitamin B12 deficiency anemia due to intrinsic factor deficiency: Secondary | ICD-10-CM | POA: Diagnosis not present

## 2019-05-03 DIAGNOSIS — E7800 Pure hypercholesterolemia, unspecified: Secondary | ICD-10-CM | POA: Diagnosis not present

## 2019-05-03 DIAGNOSIS — E039 Hypothyroidism, unspecified: Secondary | ICD-10-CM | POA: Diagnosis not present

## 2019-05-10 DIAGNOSIS — D510 Vitamin B12 deficiency anemia due to intrinsic factor deficiency: Secondary | ICD-10-CM | POA: Diagnosis not present

## 2019-05-10 DIAGNOSIS — Z23 Encounter for immunization: Secondary | ICD-10-CM | POA: Diagnosis not present

## 2019-05-10 DIAGNOSIS — E039 Hypothyroidism, unspecified: Secondary | ICD-10-CM | POA: Diagnosis not present

## 2019-05-10 DIAGNOSIS — Z0000 Encounter for general adult medical examination without abnormal findings: Secondary | ICD-10-CM | POA: Diagnosis not present

## 2019-05-10 DIAGNOSIS — E7800 Pure hypercholesterolemia, unspecified: Secondary | ICD-10-CM | POA: Diagnosis not present

## 2019-05-15 DIAGNOSIS — D510 Vitamin B12 deficiency anemia due to intrinsic factor deficiency: Secondary | ICD-10-CM | POA: Diagnosis not present

## 2019-05-15 DIAGNOSIS — E539 Vitamin B deficiency, unspecified: Secondary | ICD-10-CM | POA: Diagnosis not present

## 2019-06-15 DIAGNOSIS — D510 Vitamin B12 deficiency anemia due to intrinsic factor deficiency: Secondary | ICD-10-CM | POA: Diagnosis not present

## 2019-06-19 DIAGNOSIS — Z961 Presence of intraocular lens: Secondary | ICD-10-CM | POA: Diagnosis not present

## 2019-07-11 DIAGNOSIS — D225 Melanocytic nevi of trunk: Secondary | ICD-10-CM | POA: Diagnosis not present

## 2019-07-11 DIAGNOSIS — Z85828 Personal history of other malignant neoplasm of skin: Secondary | ICD-10-CM | POA: Diagnosis not present

## 2019-07-11 DIAGNOSIS — B353 Tinea pedis: Secondary | ICD-10-CM | POA: Diagnosis not present

## 2019-07-11 DIAGNOSIS — D2261 Melanocytic nevi of right upper limb, including shoulder: Secondary | ICD-10-CM | POA: Diagnosis not present

## 2019-07-11 DIAGNOSIS — L570 Actinic keratosis: Secondary | ICD-10-CM | POA: Diagnosis not present

## 2019-07-11 DIAGNOSIS — D2262 Melanocytic nevi of left upper limb, including shoulder: Secondary | ICD-10-CM | POA: Diagnosis not present

## 2019-07-11 DIAGNOSIS — X32XXXA Exposure to sunlight, initial encounter: Secondary | ICD-10-CM | POA: Diagnosis not present

## 2019-07-11 DIAGNOSIS — D2271 Melanocytic nevi of right lower limb, including hip: Secondary | ICD-10-CM | POA: Diagnosis not present

## 2019-07-17 DIAGNOSIS — D510 Vitamin B12 deficiency anemia due to intrinsic factor deficiency: Secondary | ICD-10-CM | POA: Diagnosis not present

## 2019-08-13 NOTE — Progress Notes (Signed)
11:03 AM   Christian Garcia 04/27/1929 [id number]  Referring provider: Kirk Ruths, MD [place] [facility] [place],  [place] [address]  Chief Complaint  Patient presents with  . Prostate Cancer    HPI: Patient is an 86 year old male with prostate cancer who presents today for a 6 month follow up.    Patient underwent cryoablation in September 2009 for a Gleason's 7 adenocarcinoma of the prostate.     Patient denies any gross hematuria, dysuria or suprapubic/flank pain.  Patient denies any fevers, chills, nausea or vomiting.   No complaints at this time.  Has received his first COVID vaccine.    IPSS    Row Name 08/14/19 1000         International Prostate Symptom Score   How often have you had the sensation of not emptying your bladder?  Not at All     How often have you had to urinate less than every two hours?  Not at All     How often have you found you stopped and started again several times when you urinated?  Not at All     How often have you found it difficult to postpone urination?  Not at All     How often have you had a weak urinary stream?  Not at All     How often have you had to strain to start urination?  Not at All     How many times did you typically get up at night to urinate?  None     Total IPSS Score  0       Quality of Life due to urinary symptoms   If you were to spend the rest of your life with your urinary condition just the way it is now how would you feel about that?  Delighted      PMH: Past Medical History:  Diagnosis Date  . BPH (benign prostatic hypertrophy)   . Elevated PSA   . HTN (hypertension)   . Peyronie's disease   . Prostate cancer [facility])     Surgical History: Past Surgical History:  Procedure Laterality Date  . APPENDECTOMY    . CATARACT EXTRACTION W/PHACO Left 03/24/2017   Procedure: CATARACT EXTRACTION PHACO AND INTRAOCULAR LENS PLACEMENT ([place]) LEFT;  Surgeon: Leandrew Koyanagi, MD;   Location: [place];  Service: Ophthalmology;  Laterality: Left;  . CATARACT EXTRACTION W/PHACO Right 05/05/2017   Procedure: CATARACT EXTRACTION PHACO AND INTRAOCULAR LENS PLACEMENT ([place]) RIGHT;  Surgeon: Leandrew Koyanagi, MD;  Location: [place];  Service: Ophthalmology;  Laterality: Right;  . CRYOABLATION  2009   prostate  . FOOT SURGERY  right   ganglion cyst removed    Home Medications:  Allergies as of 08/14/2019      Reactions   Sulfa Antibiotics    Red skin      Medication List       Accurate as of August 14, 2019 11:03 AM. If you have any questions, ask your nurse or doctor.        STOP taking these medications   betamethasone dipropionate 0.05 % cream Stopped by: Zara Council, PA-C   clotrimazole-betamethasone cream Commonly known as: LOTRISONE Stopped by: Zara Council, PA-C     TAKE these medications   Cyanocobalamin 1000 MCG/ML Kit Inject 1,000 mLs as directed.   levothyroxine 50 MCG tablet Commonly known as: SYNTHROID Take by mouth.       Allergies:  Allergies  Allergen Reactions  . Sulfa Antibiotics     Red skin    Family History: Family History  Problem Relation Age of Onset  . Prostate cancer Brother        x 3  . Breast cancer Sister   . Kidney disease Neg Hx   . Kidney cancer Neg Hx   . Bladder Cancer Neg Hx     Social History:  reports that he has never smoked. He has never used smokeless tobacco. He reports that he does not drink alcohol or use drugs.  ROS: UROLOGY Frequent Urination?: No Hard to postpone urination?: No Burning/pain with urination?: No Get up at night to urinate?: No Leakage of urine?: No Urine stream starts and stops?: No Trouble starting stream?: No Do you have to strain to urinate?: No Blood in urine?: No Urinary tract infection?: No Sexually transmitted disease?: No Injury to kidneys or bladder?: No Painful intercourse?: No Weak stream?: No Erection problems?:  No Penile pain?: No  Gastrointestinal Nausea?: No Vomiting?: No Indigestion/heartburn?: No Diarrhea?: No Constipation?: No  Constitutional Fever: No Night sweats?: No Weight loss?: No Fatigue?: No  Skin Skin rash/lesions?: No Itching?: No  Eyes Blurred vision?: No Double vision?: No  Ears/Nose/Throat Sore throat?: No Sinus problems?: No  Hematologic/Lymphatic Swollen glands?: No Easy bruising?: No  Cardiovascular Leg swelling?: No Chest pain?: No  Respiratory Cough?: No Shortness of breath?: No  Endocrine Excessive thirst?: No  Musculoskeletal Back pain?: No Joint pain?: No  Neurological Headaches?: No Dizziness?: No  Psychologic Depression?: No Anxiety?: No  Physical Exam: BP (!) 144/68   Pulse 66   Ht _0  (1.778 m)   Wt 182 lb (82.6 kg)   BMI 26.11 kg/m   Constitutional:  Well nourished. Alert and oriented, No acute distress. HEENT: [place] AT, mask in place.  Trachea midline, no masses. Cardiovascular: No clubbing, cyanosis, or edema. Respiratory: Normal respiratory effort, no increased work of breathing. GI: Abdomen is soft, non tender, non distended, no abdominal masses. Liver and spleen not palpable.  No hernias appreciated.  Stool sample for occult testing is not indicated.   GU: No CVA tenderness.  No bladder fullness or masses.  Patient with uncircumcised phallus. Foreskin easily retracted  Urethral meatus is patent.  No penile discharge. No penile lesions or rashes. Scrotum without lesions, cysts, rashes and/or edema.  Testicles are located scrotally bilaterally. No masses are appreciated in the testicles. Left and right epididymis are normal. Rectal: Patient with  normal sphincter tone. Anus and perineum without scarring or rashes. No rectal masses are appreciated. Prostate is approximately 45 grams, no nodules are appreciated. Seminal vesicles could not be palpated. Skin: No rashes, bruises or suspicious lesions. Lymph: No inguinal  adenopathy. Neurologic: Grossly intact, no focal deficits, moving all 4 extremities. Psychiatric: Normal mood and affect.  Laboratory Data: PSA history  0.6 ng/mL on 05/16/2013  0.5 ng/mL on 11/15/2013  0.6 ng/mL on 05/31/2014  0.5 ng/mL on 11/30/2014  0.6 ng/mL on 05/24/2015  0.6 ng/mL on 11/22/2015  0.7 ng/mL on 05/25/2016  0.64 ng/mL on 12/03/2016  0.76 ng/mL on 06/11/2017  0.50 ng/mL on 01/07/2018  0.61 ng/mL on 08/08/2018  0.60 ng/mL on 02/06/2019 I have reviewed the labs.     Assessment & Plan:    1. Prostate cancer [id number]):   Patient underwent cryoablation in September 2009 for a Gleason's 7 adenocarcinoma of the prostate Continue monitoring every 6 months. He will return for a PSA, IPSS and exam at  that time - if PSA remains stable    Return in about 6 months (around 02/11/2020) for IPSS, PSA and exam.  Zara Council, [facility]  [place], [place] [address] [phone]

## 2019-08-14 ENCOUNTER — Ambulatory Visit (INDEPENDENT_AMBULATORY_CARE_PROVIDER_SITE_OTHER): Payer: PPO | Admitting: Urology

## 2019-08-14 ENCOUNTER — Other Ambulatory Visit: Payer: Self-pay

## 2019-08-14 ENCOUNTER — Other Ambulatory Visit: Payer: Self-pay | Admitting: Family Medicine

## 2019-08-14 ENCOUNTER — Other Ambulatory Visit
Admission: RE | Admit: 2019-08-14 | Discharge: 2019-08-14 | Disposition: A | Discharge status: Home / Self Care | Payer: PPO | Attending: Urology | Admitting: Urology

## 2019-08-14 ENCOUNTER — Encounter: Payer: Self-pay | Admitting: Urology

## 2019-08-14 VITALS — BP 144/68 | HR 66 | Ht 70.0 in | Wt 182.0 lb

## 2019-08-14 DIAGNOSIS — C61 Malignant neoplasm of prostate: Secondary | ICD-10-CM

## 2019-08-14 LAB — PSA: Prostatic Specific Antigen: 0.58 ng/mL (ref 0.00–4.00)

## 2019-08-17 DIAGNOSIS — D510 Vitamin B12 deficiency anemia due to intrinsic factor deficiency: Secondary | ICD-10-CM | POA: Diagnosis not present

## 2019-08-29 ENCOUNTER — Encounter: Payer: Self-pay | Admitting: Emergency Medicine

## 2019-08-29 ENCOUNTER — Other Ambulatory Visit: Payer: Self-pay

## 2019-08-29 ENCOUNTER — Emergency Department
Admission: EM | Admit: 2019-08-29 | Discharge: 2019-08-29 | Disposition: A | Discharge status: Home / Self Care | Payer: PPO | Attending: Student in an Organized Health Care Education/Training Program | Admitting: Student in an Organized Health Care Education/Training Program

## 2019-08-29 ENCOUNTER — Emergency Department: Payer: PPO

## 2019-08-29 DIAGNOSIS — Z79899 Other long term (current) drug therapy: Secondary | ICD-10-CM | POA: Diagnosis not present

## 2019-08-29 DIAGNOSIS — R110 Nausea: Secondary | ICD-10-CM | POA: Insufficient documentation

## 2019-08-29 DIAGNOSIS — Z8546 Personal history of malignant neoplasm of prostate: Secondary | ICD-10-CM | POA: Insufficient documentation

## 2019-08-29 DIAGNOSIS — I10 Essential (primary) hypertension: Secondary | ICD-10-CM | POA: Diagnosis not present

## 2019-08-29 DIAGNOSIS — R112 Nausea with vomiting, unspecified: Secondary | ICD-10-CM | POA: Diagnosis not present

## 2019-08-29 DIAGNOSIS — R1084 Generalized abdominal pain: Secondary | ICD-10-CM | POA: Diagnosis not present

## 2019-08-29 DIAGNOSIS — R1110 Vomiting, unspecified: Secondary | ICD-10-CM | POA: Diagnosis not present

## 2019-08-29 LAB — COMPREHENSIVE METABOLIC PANEL
ALT: 16 U/L (ref 0–44)
AST: 23 U/L (ref 15–41)
Albumin: 4.4 g/dL (ref 3.5–5.0)
Alkaline Phosphatase: 44 U/L (ref 38–126)
Anion gap: 10 (ref 5–15)
BUN: 27 mg/dL — ABNORMAL HIGH (ref 8–23)
CO2: 24 mmol/L (ref 22–32)
Calcium: 9.5 mg/dL (ref 8.9–10.3)
Chloride: 103 mmol/L (ref 98–111)
Creatinine, Ser: 1.23 mg/dL (ref 0.61–1.24)
GFR calc Af Amer: 60 mL/min — ABNORMAL LOW (ref >60)
GFR calc non Af Amer: 52 mL/min — ABNORMAL LOW (ref >60)
Glucose, Bld: 148 mg/dL — ABNORMAL HIGH (ref 70–99)
Potassium: 5.1 mmol/L (ref 3.5–5.1)
Sodium: 137 mmol/L (ref 135–145)
Total Bilirubin: 1 mg/dL (ref 0.3–1.2)
Total Protein: 7.5 g/dL (ref 6.5–8.1)

## 2019-08-29 LAB — CBC
HCT: 40.1 % (ref 39.0–52.0)
Hemoglobin: 13.5 g/dL (ref 13.0–17.0)
MCH: 29.6 pg (ref 26.0–34.0)
MCHC: 33.7 g/dL (ref 30.0–36.0)
MCV: 87.9 fL (ref 80.0–100.0)
Platelets: 159 10*3/uL (ref 150–400)
RBC: 4.56 MIL/uL (ref 4.22–5.81)
RDW: 12 % (ref 11.5–15.5)
WBC: 9.7 10*3/uL (ref 4.0–10.5)
nRBC: 0 % (ref 0.0–0.2)

## 2019-08-29 LAB — LIPASE, BLOOD: Lipase: 25 U/L (ref 11–51)

## 2019-08-29 LAB — TROPONIN I (HIGH SENSITIVITY)
Troponin I (High Sensitivity): 3 ng/L (ref <18)
Troponin I (High Sensitivity): 3 ng/L (ref <18)

## 2019-08-29 MED ORDER — IOHEXOL 300 MG/ML  SOLN
100.0000 mL | Freq: Once | INTRAMUSCULAR | Status: AC | PRN
  Administered 2019-08-29: 18:00:00 100 mL via INTRAVENOUS

## 2019-08-29 MED ORDER — SODIUM CHLORIDE 0.9 % IV BOLUS
250.0000 mL | Freq: Once | INTRAVENOUS | Status: AC
  Administered 2019-08-29: 250 mL via INTRAVENOUS

## 2019-08-29 NOTE — Discharge Instructions (Signed)

## 2019-08-29 NOTE — ED Notes (Signed)
ED Provider at bedside. 

## 2019-08-29 NOTE — ED Notes (Signed)
Patient tolerating food well, asked for more water. No complaints at this time

## 2019-08-29 NOTE — ED Triage Notes (Signed)
Pt reports this AM when he got up he felt a little nauseted and has had dry heaves and abd discomfort all day.

## 2019-08-29 NOTE — ED Provider Notes (Signed)
[facility] Emergency Department Provider Note    First MD Initiated Contact with Patient 08/29/19 1949     (approximate)  I have reviewed the triage vital signs and the nursing notes.   HISTORY  Chief Complaint Nausea and Abdominal Pain    HPI Christian Garcia is a 86 y.o. male below listed past medical history presents to the ER for nausea and generalized crampy abdominal pain that started this morning he states after he ate some popcorn.  Has had similar episodes in the past and has had struggle with constipation.  Patient had CT imaging completed in triage due to concern for obstructive after being sent over from walk-in clinic.  Patient states that after the CT imaging was performed shortly thereafter had a bowel movement that was large and has subsequently completely resolved any of his discomfort.  Does not feel nauseated is requesting something to eat.  No recent fevers.   Past Medical History:  Diagnosis Date  . BPH (benign prostatic hypertrophy)   . Elevated PSA   . HTN (hypertension)   . Peyronie's disease   . Prostate cancer [facility])    Family History  Problem Relation Age of Onset  . Prostate cancer Brother        x 3  . Breast cancer Sister   . Kidney disease Neg Hx   . Kidney cancer Neg Hx   . Bladder Cancer Neg Hx    Past Surgical History:  Procedure Laterality Date  . APPENDECTOMY    . CATARACT EXTRACTION W/PHACO Left 03/24/2017   Procedure: CATARACT EXTRACTION PHACO AND INTRAOCULAR LENS PLACEMENT ([place]) LEFT;  Surgeon: Leandrew Koyanagi, MD;  Location: [place];  Service: Ophthalmology;  Laterality: Left;  . CATARACT EXTRACTION W/PHACO Right 05/05/2017   Procedure: CATARACT EXTRACTION PHACO AND INTRAOCULAR LENS PLACEMENT ([place]) RIGHT;  Surgeon: Leandrew Koyanagi, MD;  Location: [place];  Service: Ophthalmology;  Laterality: Right;  . CRYOABLATION  2009   prostate  . FOOT SURGERY  right   ganglion cyst  removed   Patient Active Problem List   Diagnosis Date Noted  . Prostate cancer ([place]) 05/31/2015  . Health care maintenance 04/11/2015  . Acquired hypothyroidism 03/31/2014  . HLD (hyperlipidemia) 03/31/2014  . PA (pernicious anemia) 03/31/2014      Prior to Admission medications   Medication Sig Start Date End Date Taking? Authorizing Provider  Cyanocobalamin 1000 MCG/ML KIT Inject 1,000 mLs as directed.    [provider]  levothyroxine (SYNTHROID, LEVOTHROID) 50 MCG tablet Take by mouth. 10/13/16 08/14/19  [provider]    Allergies Sulfa antibiotics    Social History Social History   Tobacco Use  . Smoking status: Never Smoker  . Smokeless tobacco: Never Used  Substance Use Topics  . Alcohol use: No    Alcohol/week: 0.0 standard drinks  . Drug use: No    Review of Systems Patient denies headaches, rhinorrhea, blurry vision, numbness, shortness of breath, chest pain, edema, cough, abdominal pain, nausea, vomiting, diarrhea, dysuria, fevers, rashes or hallucinations unless otherwise stated above in HPI. ____________________________________________   PHYSICAL EXAM:  VITAL SIGNS: Vitals:   08/29/19 1609 08/29/19 1735  BP: 129/83 129/74  Pulse: 99 81  Resp: 18 20  Temp: 98.4 F (36.9 C) 98.6 F (37 C)  SpO2: 97% 100%    Constitutional: Alert and oriented.  Eyes: Conjunctivae are normal.  Head: Atraumatic. Nose: No congestion/rhinnorhea. Mouth/Throat: Mucous membranes are moist.   Neck: No stridor. Painless  ROM.  Cardiovascular: Normal rate, regular rhythm. Grossly normal heart sounds.  Good peripheral circulation. Respiratory: Normal respiratory effort.  No retractions. Lungs CTAB. Gastrointestinal: Soft and nontender in all four quadrants. No distention. No abdominal bruits. No CVA tenderness. Genitourinary:  Musculoskeletal: No lower extremity tenderness nor edema.  No joint effusions. Neurologic:  Normal speech and language. No  gross focal neurologic deficits are appreciated. No facial droop Skin:  Skin is warm, dry and intact. No rash noted. Psychiatric: Mood and affect are normal. Speech and behavior are normal.  ____________________________________________   LABS (all labs ordered are listed, but only abnormal results are displayed)  Results for orders placed or performed during the hospital encounter of 08/29/19 (from the past 24 hour(s))  Lipase, blood     Status: None   Collection Time: 08/29/19  4:10 PM  Result Value Ref Range   Lipase 25 11 - 51 U/L  Comprehensive metabolic panel     Status: Abnormal   Collection Time: 08/29/19  4:10 PM  Result Value Ref Range   Sodium 137 135 - 145 mmol/L   Potassium 5.1 3.5 - 5.1 mmol/L   Chloride 103 98 - 111 mmol/L   CO2 24 22 - 32 mmol/L   Glucose, Bld 148 (H) 70 - 99 mg/dL   BUN 27 (H) 8 - 23 mg/dL   Creatinine, Ser 1.23 0.61 - 1.24 mg/dL   Calcium 9.5 8.9 - 10.3 mg/dL   Total Protein 7.5 6.5 - 8.1 g/dL   Albumin 4.4 3.5 - 5.0 g/dL   AST 23 15 - 41 U/L   ALT 16 0 - 44 U/L   Alkaline Phosphatase 44 38 - 126 U/L   Total Bilirubin 1.0 0.3 - 1.2 mg/dL   GFR calc non Af Amer 52 (L) >60 mL/min   GFR calc Af Amer 60 (L) >60 mL/min   Anion gap 10 5 - 15  CBC     Status: None   Collection Time: 08/29/19  4:10 PM  Result Value Ref Range   WBC 9.7 4.0 - 10.5 K/uL   RBC 4.56 4.22 - 5.81 MIL/uL   Hemoglobin 13.5 13.0 - 17.0 g/dL   HCT 40.1 39.0 - 52.0 %   MCV 87.9 80.0 - 100.0 fL   MCH 29.6 26.0 - 34.0 pg   MCHC 33.7 30.0 - 36.0 g/dL   RDW 12.0 11.5 - 15.5 %   Platelets 159 150 - 400 K/uL   nRBC 0.0 0.0 - 0.2 %  Troponin I (High Sensitivity)     Status: None   Collection Time: 08/29/19  4:10 PM  Result Value Ref Range   Troponin I (High Sensitivity) 3 <18 ng/L  Troponin I (High Sensitivity)     Status: None   Collection Time: 08/29/19  6:02 PM  Result Value Ref Range   Troponin I (High Sensitivity) 3 <18 ng/L    ____________________________________________  EKG My review and personal interpretation at Time: 16:14   Indication: abdominal pain  Rate: 90  Rhythm: sinus Axis: normal Other: poor r wave progression, no stemi ____________________________________________  RADIOLOGY  I personally reviewed all radiographic images ordered to evaluate for the above acute complaints and reviewed radiology reports and findings.  These findings were personally discussed with the patient.  Please see medical record for radiology report.  ____________________________________________   PROCEDURES  Procedure(s) performed:  Procedures    Critical Care performed: no ____________________________________________   INITIAL IMPRESSION / ASSESSMENT AND PLAN / ED COURSE  Pertinent  labs & imaging results that were available during my care of the patient were reviewed by me and considered in my medical decision making (see chart for details).   DDX: sbo, ileus, enteritis, coitis, diverticulitis, mass  Christian Garcia is a 86 y.o. who presents to the ED with symptoms as described above.  Patient currently well-appearing in no acute distress.  His abdominal exam is soft benign.  States complete resolution of symptoms after moving bowels that was completed after the CT imaging.  Suspect this may be more likely ileus than an SBO.  Is blood work is reassuring.  Will trial p.o.  If he is having recurrent nausea will clearly be admitted for partial small bowel obstruction, however the patient appears clinically exceedingly well.  Clinical Course as of Aug 28 2134  Tue Aug 29, 2019  2125 Patient remains completely asymptomatic.  Repeat abdominal exam is soft and benign.  He is tolerating p.o. without any nausea or vomiting.  Suspect this was more consistent with transient ileus that is since resolved.  At this point I think is appropriate for discharge home.  Discussed signs and symptoms which he should return to the ER.    [PR]    Clinical Course User Index [PR] Merlyn Lot, MD    The patient was evaluated in Emergency Department today for the symptoms described in the history of present illness. He/she was evaluated in the context of the global COVID-19 pandemic, which necessitated consideration that the patient might be at risk for infection with the SARS-CoV-2 virus that causes COVID-19. Institutional protocols and algorithms that pertain to the evaluation of patients at risk for COVID-19 are in a state of rapid change based on information released by regulatory bodies including the CDC and federal and state organizations. These policies and algorithms were followed during the patient's care in the ED.  As part of my medical decision making, I reviewed the following data within the [place] notes reviewed and incorporated, Labs reviewed, notes from prior ED visits and [place] Controlled Substance Database   ____________________________________________   FINAL CLINICAL IMPRESSION(S) / ED DIAGNOSES  Final diagnoses:  Nausea  Generalized abdominal pain      NEW MEDICATIONS STARTED DURING THIS VISIT:  New Prescriptions   No medications on file     Note:  This document was prepared using Dragon voice recognition software and may include unintentional dictation errors.    Merlyn Lot, MD 08/29/19 2136

## 2019-08-29 NOTE — ED Notes (Signed)
In restroom if called

## 2019-08-29 NOTE — ED Notes (Signed)
Patient provided with meal tray and water. No complaints at this time, says he feels "much better" after using the bathroom in waiting room

## 2019-09-18 DIAGNOSIS — D510 Vitamin B12 deficiency anemia due to intrinsic factor deficiency: Secondary | ICD-10-CM | POA: Diagnosis not present

## 2019-10-19 DIAGNOSIS — D510 Vitamin B12 deficiency anemia due to intrinsic factor deficiency: Secondary | ICD-10-CM | POA: Diagnosis not present

## 2019-11-01 DIAGNOSIS — E7800 Pure hypercholesterolemia, unspecified: Secondary | ICD-10-CM | POA: Diagnosis not present

## 2019-11-01 DIAGNOSIS — E039 Hypothyroidism, unspecified: Secondary | ICD-10-CM | POA: Diagnosis not present

## 2019-11-08 DIAGNOSIS — E7800 Pure hypercholesterolemia, unspecified: Secondary | ICD-10-CM | POA: Diagnosis not present

## 2019-11-08 DIAGNOSIS — E039 Hypothyroidism, unspecified: Secondary | ICD-10-CM | POA: Diagnosis not present

## 2019-11-08 DIAGNOSIS — N1831 Chronic kidney disease, stage 3a: Secondary | ICD-10-CM | POA: Diagnosis not present

## 2019-11-08 DIAGNOSIS — D510 Vitamin B12 deficiency anemia due to intrinsic factor deficiency: Secondary | ICD-10-CM | POA: Diagnosis not present

## 2019-11-21 DIAGNOSIS — D510 Vitamin B12 deficiency anemia due to intrinsic factor deficiency: Secondary | ICD-10-CM | POA: Diagnosis not present

## 2019-12-29 DIAGNOSIS — D510 Vitamin B12 deficiency anemia due to intrinsic factor deficiency: Secondary | ICD-10-CM | POA: Diagnosis not present

## 2020-01-25 ENCOUNTER — Encounter: Payer: Self-pay | Admitting: *Deleted

## 2020-01-25 ENCOUNTER — Inpatient Hospital Stay
Admission: EM | Admit: 2020-01-25 | Discharge: 2020-01-28 | DRG: 390 | Disposition: A | Discharge status: Home / Self Care | Payer: PPO | Attending: Hospitalist | Admitting: Hospitalist

## 2020-01-25 ENCOUNTER — Other Ambulatory Visit: Payer: Self-pay

## 2020-01-25 DIAGNOSIS — Z8719 Personal history of other diseases of the digestive system: Secondary | ICD-10-CM

## 2020-01-25 DIAGNOSIS — Z8042 Family history of malignant neoplasm of prostate: Secondary | ICD-10-CM

## 2020-01-25 DIAGNOSIS — R1110 Vomiting, unspecified: Secondary | ICD-10-CM | POA: Diagnosis not present

## 2020-01-25 DIAGNOSIS — Z20822 Contact with and (suspected) exposure to covid-19: Secondary | ICD-10-CM | POA: Diagnosis present

## 2020-01-25 DIAGNOSIS — Z03818 Encounter for observation for suspected exposure to other biological agents ruled out: Secondary | ICD-10-CM | POA: Diagnosis not present

## 2020-01-25 DIAGNOSIS — Z8546 Personal history of malignant neoplasm of prostate: Secondary | ICD-10-CM | POA: Diagnosis not present

## 2020-01-25 DIAGNOSIS — K56609 Unspecified intestinal obstruction, unspecified as to partial versus complete obstruction: Secondary | ICD-10-CM | POA: Diagnosis present

## 2020-01-25 DIAGNOSIS — Z4682 Encounter for fitting and adjustment of non-vascular catheter: Secondary | ICD-10-CM | POA: Diagnosis not present

## 2020-01-25 DIAGNOSIS — N486 Induration penis plastica: Secondary | ICD-10-CM | POA: Diagnosis present

## 2020-01-25 DIAGNOSIS — Z882 Allergy status to sulfonamides status: Secondary | ICD-10-CM | POA: Diagnosis not present

## 2020-01-25 DIAGNOSIS — I10 Essential (primary) hypertension: Secondary | ICD-10-CM | POA: Diagnosis present

## 2020-01-25 DIAGNOSIS — N400 Enlarged prostate without lower urinary tract symptoms: Secondary | ICD-10-CM | POA: Diagnosis present

## 2020-01-25 DIAGNOSIS — Z79890 Hormone replacement therapy (postmenopausal): Secondary | ICD-10-CM

## 2020-01-25 DIAGNOSIS — E039 Hypothyroidism, unspecified: Secondary | ICD-10-CM | POA: Diagnosis present

## 2020-01-25 DIAGNOSIS — Z79899 Other long term (current) drug therapy: Secondary | ICD-10-CM | POA: Diagnosis not present

## 2020-01-25 LAB — COMPREHENSIVE METABOLIC PANEL
ALT: 14 U/L (ref 0–44)
AST: 22 U/L (ref 15–41)
Albumin: 4 g/dL (ref 3.5–5.0)
Alkaline Phosphatase: 37 U/L — ABNORMAL LOW (ref 38–126)
Anion gap: 11 (ref 5–15)
BUN: 20 mg/dL (ref 8–23)
CO2: 26 mmol/L (ref 22–32)
Calcium: 9.2 mg/dL (ref 8.9–10.3)
Chloride: 101 mmol/L (ref 98–111)
Creatinine, Ser: 1.15 mg/dL (ref 0.61–1.24)
GFR calc Af Amer: >60 mL/min (ref >60)
GFR calc non Af Amer: 56 mL/min — ABNORMAL LOW (ref >60)
Glucose, Bld: 138 mg/dL — ABNORMAL HIGH (ref 70–99)
Potassium: 5 mmol/L (ref 3.5–5.1)
Sodium: 138 mmol/L (ref 135–145)
Total Bilirubin: 0.9 mg/dL (ref 0.3–1.2)
Total Protein: 7 g/dL (ref 6.5–8.1)

## 2020-01-25 LAB — CBC
HCT: 38.8 % — ABNORMAL LOW (ref 39.0–52.0)
Hemoglobin: 13 g/dL (ref 13.0–17.0)
MCH: 29.2 pg (ref 26.0–34.0)
MCHC: 33.5 g/dL (ref 30.0–36.0)
MCV: 87.2 fL (ref 80.0–100.0)
Platelets: 154 10*3/uL (ref 150–400)
RBC: 4.45 MIL/uL (ref 4.22–5.81)
RDW: 12.1 % (ref 11.5–15.5)
WBC: 8.6 10*3/uL (ref 4.0–10.5)
nRBC: 0 % (ref 0.0–0.2)

## 2020-01-25 LAB — URINALYSIS, COMPLETE (UACMP) WITH MICROSCOPIC
Bacteria, UA: NONE SEEN
Bilirubin Urine: NEGATIVE
Glucose, UA: NEGATIVE mg/dL
Hgb urine dipstick: NEGATIVE
Ketones, ur: 5 mg/dL — AB
Leukocytes,Ua: NEGATIVE
Nitrite: NEGATIVE
Protein, ur: NEGATIVE mg/dL
Specific Gravity, Urine: 1.023 (ref 1.005–1.030)
pH: 5 (ref 5.0–8.0)

## 2020-01-25 LAB — LIPASE, BLOOD: Lipase: 27 U/L (ref 11–51)

## 2020-01-25 MED ORDER — SODIUM CHLORIDE 0.9% FLUSH: 3.0000 mL | Freq: Once | INTRAVENOUS | Status: DC

## 2020-01-25 NOTE — ED Triage Notes (Signed)
Pt ambulatory to triage.  Pt has abd pain and vomited x 1.  Diarrhea x 1.   No chest pain or sob.  Pt alert  Speech clear.

## 2020-01-26 ENCOUNTER — Emergency Department: Payer: PPO

## 2020-01-26 ENCOUNTER — Observation Stay: Payer: PPO

## 2020-01-26 DIAGNOSIS — K56609 Unspecified intestinal obstruction, unspecified as to partial versus complete obstruction: Secondary | ICD-10-CM

## 2020-01-26 DIAGNOSIS — E039 Hypothyroidism, unspecified: Secondary | ICD-10-CM | POA: Diagnosis present

## 2020-01-26 DIAGNOSIS — Z79890 Hormone replacement therapy (postmenopausal): Secondary | ICD-10-CM | POA: Diagnosis not present

## 2020-01-26 DIAGNOSIS — Z4682 Encounter for fitting and adjustment of non-vascular catheter: Secondary | ICD-10-CM | POA: Diagnosis not present

## 2020-01-26 DIAGNOSIS — Z8546 Personal history of malignant neoplasm of prostate: Secondary | ICD-10-CM | POA: Diagnosis not present

## 2020-01-26 DIAGNOSIS — I10 Essential (primary) hypertension: Secondary | ICD-10-CM | POA: Diagnosis present

## 2020-01-26 DIAGNOSIS — Z8042 Family history of malignant neoplasm of prostate: Secondary | ICD-10-CM | POA: Diagnosis not present

## 2020-01-26 DIAGNOSIS — Z20822 Contact with and (suspected) exposure to covid-19: Secondary | ICD-10-CM | POA: Diagnosis present

## 2020-01-26 DIAGNOSIS — Z882 Allergy status to sulfonamides status: Secondary | ICD-10-CM | POA: Diagnosis not present

## 2020-01-26 DIAGNOSIS — Z79899 Other long term (current) drug therapy: Secondary | ICD-10-CM | POA: Diagnosis not present

## 2020-01-26 DIAGNOSIS — N400 Enlarged prostate without lower urinary tract symptoms: Secondary | ICD-10-CM | POA: Diagnosis present

## 2020-01-26 DIAGNOSIS — Z8719 Personal history of other diseases of the digestive system: Secondary | ICD-10-CM

## 2020-01-26 DIAGNOSIS — N486 Induration penis plastica: Secondary | ICD-10-CM | POA: Diagnosis present

## 2020-01-26 LAB — SARS CORONAVIRUS 2 BY RT PCR (HOSPITAL ORDER, PERFORMED IN ~~LOC~~ HOSPITAL LAB): SARS Coronavirus 2: NEGATIVE

## 2020-01-26 MED ORDER — IOHEXOL 300 MG/ML  SOLN
100.0000 mL | Freq: Once | INTRAMUSCULAR | Status: AC | PRN
  Administered 2020-01-26: 100 mL via INTRAVENOUS

## 2020-01-26 MED ORDER — MORPHINE SULFATE (PF) 2 MG/ML IV SOLN: 2.0000 mg | INTRAVENOUS | Status: DC | PRN

## 2020-01-26 MED ORDER — ONDANSETRON HCL 4 MG/2ML IJ SOLN: 4.0000 mg | Freq: Four times a day (QID) | INTRAMUSCULAR | Status: DC | PRN

## 2020-01-26 MED ORDER — ENOXAPARIN SODIUM 40 MG/0.4ML ~~LOC~~ SOLN
40.0000 mg | SUBCUTANEOUS | Status: DC
  Administered 2020-01-26 – 2020-01-27 (×2): 40 mg via SUBCUTANEOUS
  Filled 2020-01-26 (×3): qty 0.4

## 2020-01-26 MED ORDER — SODIUM CHLORIDE 0.9 % IV BOLUS
1000.0000 mL | Freq: Once | INTRAVENOUS | Status: AC
  Administered 2020-01-26: 1000 mL via INTRAVENOUS

## 2020-01-26 MED ORDER — SODIUM CHLORIDE 0.9 % IV SOLN: INTRAVENOUS | Status: DC

## 2020-01-26 MED ORDER — ONDANSETRON HCL 4 MG PO TABS: 4.0000 mg | ORAL_TABLET | Freq: Four times a day (QID) | ORAL | Status: DC | PRN

## 2020-01-26 NOTE — H&P (Signed)
History and Physical    Christian Garcia:[id number] DOB: [birth year] DOA: 01/25/2020  PCP: Kirk Ruths, MD   Patient coming from: Home  I have personally briefly reviewed patient's old medical records in [place]  Chief Complaint: Abdominal pain, vomiting  HPI: Christian Garcia is a 86 y.o. male with medical history significant for history of breast cancer, acquired hypothyroidism, with history of small bowel obstruction in January 2021 that resolved spontaneously in the ER and discharged, who presents to the emergency room with similar symptoms to last small bowel obstruction consisting of abdominal pain and vomiting.  Patient reports generalized abdominal crampy pain of moderate intensity that developed in the last 24 hours, associated with a bloating sensation.  He later had an episode of nonbilious nonbloody nonfeculent vomitus.  He stated the vomit had remnants of his breakfast that he had eaten several hours earlier.  He also states that he was not passing flatus as was usually the case after eating beans.  Denies fever or chills.  Has no chest pain shortness of breath or cough ED Course: On arrival he had a low-grade temperature of 100.5 with otherwise normal vitals.  On his blood work white cell count was normal at 8.6 and overall blood work was unremarkable.  Urinalysis normal.  CT abdomen and pelvis showed findings consistent with early obstruction.  Patient was given a fluid bolus.  NG tube inserted.  Hospitalist consulted for admission.  Review of Systems: As per HPI otherwise all other systems on review of systems negative.    Past Medical History:  Diagnosis Date  . BPH (benign prostatic hypertrophy)   . Elevated PSA   . HTN (hypertension)   . Peyronie's disease   . Prostate cancer [facility])     Past Surgical History:  Procedure Laterality Date  . APPENDECTOMY    . CATARACT EXTRACTION W/PHACO Left 03/24/2017   Procedure: CATARACT EXTRACTION PHACO AND  INTRAOCULAR LENS PLACEMENT ([place]) LEFT;  Surgeon: Leandrew Koyanagi, MD;  Location: [place];  Service: Ophthalmology;  Laterality: Left;  . CATARACT EXTRACTION W/PHACO Right 05/05/2017   Procedure: CATARACT EXTRACTION PHACO AND INTRAOCULAR LENS PLACEMENT ([place]) RIGHT;  Surgeon: Leandrew Koyanagi, MD;  Location: [place];  Service: Ophthalmology;  Laterality: Right;  . CRYOABLATION  2009   prostate  . FOOT SURGERY  right   ganglion cyst removed     reports that he has never smoked. He has never used smokeless tobacco. He reports that he does not drink alcohol and does not use drugs.  Allergies  Allergen Reactions  . Sulfa Antibiotics Other (See Comments)    Red skin    Family History  Problem Relation Age of Onset  . Prostate cancer Brother        x 3  . Breast cancer Sister   . Kidney disease Neg Hx   . Kidney cancer Neg Hx   . Bladder Cancer Neg Hx       Prior to Admission medications   Medication Sig Start Date End Date Taking? Authorizing Provider  Cyanocobalamin 1000 MCG/ML KIT Inject 1,000 mcg into the muscle every 30 (thirty) days.    Yes [provider]  levothyroxine (SYNTHROID) 75 MCG tablet Take 75 mcg by mouth daily before breakfast. 08/15/19  Yes [provider]    Physical Exam: Vitals:   01/26/20 0010 01/26/20 0013 01/26/20 0030 01/26/20 0130  BP:  (!) 149/95 135/77 (!) 144/62  Pulse:  81 74 67  Resp:  $'20 18 20  'z$ Temp: 98.3 F (36.8 C)     TempSrc: Oral     SpO2:  100% 97% 96%  Weight:      Height:         Vitals:   01/26/20 0010 01/26/20 0013 01/26/20 0030 01/26/20 0130  BP:  (!) 149/95 135/77 (!) 144/62  Pulse:  81 74 67  Resp:  '20 18 20  '$ Temp: 98.3 F (36.8 C)     TempSrc: Oral     SpO2:  100% 97% 96%  Weight:      Height:          Constitutional: Alert and oriented x 3 . Not in any apparent distress HEENT: NG tube      Head: Normocephalic and atraumatic.         Eyes: PERLA, EOMI,  Conjunctivae are normal. Sclera is non-icteric.       Mouth/Throat: Mucous membranes are moist.       Neck: Supple with no signs of meningismus. Cardiovascular: Regular rate and rhythm. No murmurs, gallops, or rubs. 2+ symmetrical distal pulses are present . No JVD. No LE edema Respiratory: Respiratory effort normal .Lungs sounds clear bilaterally. No wheezes, crackles, or rhonchi.  Gastrointestinal: Soft, mild periumbilical tenderness, and non distended with sluggish bowel sounds. No rebound or guarding. Genitourinary: No CVA tenderness. Musculoskeletal: Nontender with normal range of motion in all extremities. No cyanosis, or erythema of extremities. Neurologic: Normal speech and language. Face is symmetric. Moving all extremities. No gross focal neurologic deficits . Skin: Skin is warm, dry.  No rash or ulcers Psychiatric: Mood and affect are normal Speech and behavior are normal   Labs on Admission: I have personally reviewed following labs and imaging studies  CBC: Recent Labs  Lab 01/25/20 2029  WBC 8.6  HGB 13.0  HCT 38.8*  MCV 87.2  PLT 169   Basic Metabolic Panel: Recent Labs  Lab 01/25/20 2029  NA 138  K 5.0  CL 101  CO2 26  GLUCOSE 138*  BUN 20  CREATININE 1.15  CALCIUM 9.2   GFR: Estimated Creatinine Clearance: 45 mL/min (by C-G formula based on SCr of 1.15 mg/dL). Liver Function Tests: Recent Labs  Lab 01/25/20 2029  AST 22  ALT 14  ALKPHOS 37*  BILITOT 0.9  PROT 7.0  ALBUMIN 4.0   Recent Labs  Lab 01/25/20 2029  LIPASE 27   No results for input(s): AMMONIA in the last 168 hours. Coagulation Profile: No results for input(s): INR, PROTIME in the last 168 hours. Cardiac Enzymes: No results for input(s): CKTOTAL, CKMB, CKMBINDEX, TROPONINI in the last 168 hours. BNP (last 3 results) No results for input(s): PROBNP in the last 8760 hours. HbA1C: No results for input(s): HGBA1C in the last 72 hours. CBG: No results for input(s): GLUCAP in the  last 168 hours. Lipid Profile: No results for input(s): CHOL, HDL, LDLCALC, TRIG, CHOLHDL, LDLDIRECT in the last 72 hours. Thyroid Function Tests: No results for input(s): TSH, T4TOTAL, FREET4, T3FREE, THYROIDAB in the last 72 hours. Anemia Panel: No results for input(s): VITAMINB12, FOLATE, FERRITIN, TIBC, IRON, RETICCTPCT in the last 72 hours. Urine analysis:    Component Value Date/Time   COLORURINE YELLOW (A) 01/25/2020 2029   APPEARANCEUR HAZY (A) 01/25/2020 2029   LABSPEC 1.023 01/25/2020 2029   PHURINE 5.0 01/25/2020 2029   GLUCOSEU NEGATIVE 01/25/2020 2029   HGBUR NEGATIVE 01/25/2020 2029   BILIRUBINUR NEGATIVE 01/25/2020 2029   KETONESUR 5 (A) 01/25/2020 2029  PROTEINUR NEGATIVE 01/25/2020 2029   UROBILINOGEN 0.2 01/18/2009 1926   NITRITE NEGATIVE 01/25/2020 2029   LEUKOCYTESUR NEGATIVE 01/25/2020 2029    Radiological Exams on Admission: CT ABDOMEN PELVIS W CONTRAST  Result Date: 01/26/2020 CLINICAL DATA:  Abdominal pain and vomiting. EXAM: CT ABDOMEN AND PELVIS WITH CONTRAST TECHNIQUE: Multidetector CT imaging of the abdomen and pelvis was performed using the standard protocol following bolus administration of intravenous contrast. CONTRAST:  138m OMNIPAQUE IOHEXOL 300 MG/ML  SOLN COMPARISON:  None. FINDINGS: LOWER CHEST: Normal. HEPATOBILIARY: Normal hepatic contours. No intra- or extrahepatic biliary dilatation. The gallbladder is normal. PANCREAS: Normal pancreas. No ductal dilatation or peripancreatic fluid collection. SPLEEN: Multiple calcified granulomata. ADRENALS/URINARY TRACT: The adrenal glands are normal. No hydronephrosis, nephroureterolithiasis or solid renal mass. The urinary bladder is normal for degree of distention STOMACH/BOWEL: There is no hiatal hernia. Normal duodenal course and caliber. The proximal small bowel is fluid-filled and measures at the upper limits of normal for caliber. There is a transition to a smaller caliber in the right mid abdomen (axial  image 43 and coronal image 24. Colon is decompressed. The appendix is not visualized. No right lower quadrant inflammation or free fluid. VASCULAR/LYMPHATIC: There is calcific atherosclerosis of the abdominal aorta. No abdominal or pelvic lymphadenopathy. REPRODUCTIVE: Prostate is enlarged and heterogeneous measuring 4.0 x 5.9 x 4.6 cm MUSCULOSKELETAL. No bony spinal canal stenosis or focal osseous abnormality. OTHER: None. IMPRESSION: 1. Proximal small bowel upper limits of normal in size with transition to smaller caliber in the right mid abdomen. This may indicate early obstruction. The appearance is similar to 08/29/19. 2. Enlarged, heterogeneous prostate gland. 3. Aortic Atherosclerosis (ICD10-I70.0). Electronically Signed   By: KUlyses JarredM.D.   On: 01/26/2020 01:18    EKG: Independently reviewed. Interpretation : Normal sinus rhythm with nonspecific ST-T wave changes  Assessment/Plan  86year old male with history of hypothyroidism, prostate cancer, SBO on 08/29/2019 that resolved on its own presenting with similar symptoms of abdominal pain, bloating and vomiting.  Early SBO on CT.  Principal Problem:   [place]   History of small bowel obstruction on 08/29/2019 -Patient with abdominal pain, vomiting of food contents from several hours prior, not passing flatus and abdominal bloating -CT ABDOMEN PELVIS W CONTRAST  1. Proximal small bowel upper limits of normal in size with transition to smaller caliber in the right mid abdomen. This may indicate early obstruction. The appearance is similar to 08/29/19.  -Continue NG tube placed in the ER to low intermittent suction -IV hydration -Conservative management for now and consult surgery if not improving    Acquired hypothyroidism -Can hold home levothyroxine while n.p.o.    History of prostate cancer -No acute concerns    DVT prophylaxis: Lovenox  Code Status: full code  Family Communication:  none   Disposition Plan: Back to previous home environment Consults called: none  Status:obs    HAthena MasseMD [facility]     01/26/2020, 2:26 AM

## 2020-01-26 NOTE — ED Notes (Signed)
Pt to CT

## 2020-01-26 NOTE — Progress Notes (Addendum)
PROGRESS NOTE    Christian Garcia  TDV:[id number] DOB: 05/07/1929 DOA: 01/25/2020 PCP: Kirk Ruths, MD    Assessment & Plan:   Principal Problem:   SBO (small bowel obstruction) ([place]) Active Problems:   Acquired hypothyroidism   History of prostate cancer   History of small bowel obstruction   Small bowel obstruction (HCC)    Christian Garcia is a 86 y.o. male with medical history significant for history of acquired hypothyroidism, with history of small bowel obstruction in January 2021 that resolved spontaneously in the ER and discharged, who presents to the emergency room with similar symptoms to last small bowel obstruction consisting of abdominal pain and vomiting.  Patient reports generalized abdominal crampy pain of moderate intensity that developed in the last 24 hours, associated with a bloating sensation.  He later had an episode of nonbilious nonbloody nonfeculent vomitus.   SBO (small bowel obstruction) (HCC)   History of small bowel obstruction on 08/29/2019 -Patient with abdominal pain, vomiting of food contents from several hours prior, not passing flatus and abdominal bloating -CT ABDOMEN PELVIS W CONTRAST  1. Proximal small bowel upper limits of normal in size with transition to smaller caliber in the right mid abdomen. This may indicate early obstruction. The appearance is similar to 08/29/19.  --NG tube placed in the ER to low intermittent suction PLAN: --NG tube clamping trial for 4 hours.  If residue gastric output <200 ml, will start clear liquid diet. -continue MIVF -Conservative management for now and consult surgery if not improving    Acquired hypothyroidism -Can hold home levothyroxine while n.p.o.    History of prostate cancer -No acute concerns   DVT prophylaxis: Lovenox SQ Code Status: Full code  Family Communication:  Status is: changed to inpatient today Dispo:   The patient is from: home Anticipated d/c is to: home Anticipated d/c  date is: 1-2 days Patient currently is not medically stable to d/c due to: still on NG tube due to SBO.  Need to advance diet before discharge.   Subjective and Interval History:  Christian Garcia reported abdominal pressure resolved.  No pain.  No N/V/D.  Per nursing, NG tube after adjusting put out 150 ml.  No fever, dyspnea, chest pain, dysuria.   Objective: Vitals:   01/26/20 1258 01/26/20 1441 01/26/20 1910 01/26/20 2354  BP: 137/65 112/67 121/63 128/64  Pulse: 61 64 63 62  Resp:  16 16 18   Temp:  98.4 F (36.9 C) 98 F (36.7 C) 98.1 F (36.7 C)  TempSrc:   Oral Oral  SpO2:  99% 97% 95%  Weight:      Height:        Intake/Output Summary (Last 24 hours) at 01/27/2020 0234 Last data filed at 01/26/2020 1500 Gross per 24 hour  Intake --  Output 751 ml  Net -751 ml   Filed Weights   01/25/20 2022 01/26/20 0640  Weight: 77.1 kg 80.2 kg    Examination:   Constitutional: NAD, AAOx3 HEENT: conjunctivae and lids normal, EOMI CV: RRR no M,R,G. Distal pulses +2.  No cyanosis.   RESP: CTA B/L, normal respiratory effort  GI: No BS, NTND.  NG tube outputting clear liquid with small debris.   Extremities: No effusions, edema, or tenderness in BLE SKIN: warm, dry and intact Neuro: II - XII grossly intact.  Sensation intact Psych: Normal mood and affect.  Appropriate judgement and reason   Data Reviewed: I have personally reviewed following labs and imaging studies  CBC: Recent Labs  Lab 01/25/20 2029  WBC 8.6  HGB 13.0  HCT 38.8*  MCV 87.2  PLT 528   Basic Metabolic Panel: Recent Labs  Lab 01/25/20 2029  NA 138  K 5.0  CL 101  CO2 26  GLUCOSE 138*  BUN 20  CREATININE 1.15  CALCIUM 9.2   GFR: Estimated Creatinine Clearance: 45 mL/min (by C-G formula based on SCr of 1.15 mg/dL). Liver Function Tests: Recent Labs  Lab 01/25/20 2029  AST 22  ALT 14  ALKPHOS 37*  BILITOT 0.9  PROT 7.0  ALBUMIN 4.0   Recent Labs  Lab 01/25/20 2029  LIPASE 27   No results for  input(s): AMMONIA in the last 168 hours. Coagulation Profile: No results for input(s): INR, PROTIME in the last 168 hours. Cardiac Enzymes: No results for input(s): CKTOTAL, CKMB, CKMBINDEX, TROPONINI in the last 168 hours. BNP (last 3 results) No results for input(s): PROBNP in the last 8760 hours. HbA1C: No results for input(s): HGBA1C in the last 72 hours. CBG: No results for input(s): GLUCAP in the last 168 hours. Lipid Profile: No results for input(s): CHOL, HDL, LDLCALC, TRIG, CHOLHDL, LDLDIRECT in the last 72 hours. Thyroid Function Tests: No results for input(s): TSH, T4TOTAL, FREET4, T3FREE, THYROIDAB in the last 72 hours. Anemia Panel: No results for input(s): VITAMINB12, FOLATE, FERRITIN, TIBC, IRON, RETICCTPCT in the last 72 hours. Sepsis Labs: No results for input(s): PROCALCITON, LATICACIDVEN in the last 168 hours.  Recent Results (from the past 240 hour(s))  SARS Coronavirus 2 by RT PCR (hospital order, performed in [facility] hospital lab) Nasopharyngeal Nasopharyngeal Swab     Status: None   Collection Time: 01/26/20  2:30 AM   Specimen: Nasopharyngeal Swab  Result Value Ref Range Status   SARS Coronavirus 2 NEGATIVE NEGATIVE Final    Comment: (NOTE) SARS-CoV-2 target nucleic acids are NOT DETECTED.  The SARS-CoV-2 RNA is generally detectable in upper and lower respiratory specimens during the acute phase of infection. The lowest concentration of SARS-CoV-2 viral copies this assay can detect is 250 copies / mL. A negative result does not preclude SARS-CoV-2 infection and should not be used as the sole basis for treatment or other patient management decisions.  A negative result may occur with improper specimen collection / handling, submission of specimen other than nasopharyngeal swab, presence of viral mutation(s) within the areas targeted by this assay, and inadequate number of viral copies (<250 copies / mL). A negative result must be combined with  clinical observations, patient history, and epidemiological information.  Fact Sheet for Patients:   StrictlyIdeas.no  Fact Sheet for Healthcare Providers: BankingDealers.co.za  This test is not yet approved or  cleared by the [place] FDA and has been authorized for detection and/or diagnosis of SARS-CoV-2 by FDA under an Emergency Use Authorization (EUA).  This EUA will remain in effect (meaning this test can be used) for the duration of the COVID-19 declaration under Section 564(b)(1) of the Act, 21 U.S.C. section 360bbb-3(b)(1), unless the authorization is terminated or revoked sooner.  Performed at [facility], [address] [place], [place] [address]       Radiology Studies: DG Abdomen 1 View  Result Date: 01/26/2020 CLINICAL DATA:  NG tube placement. EXAM: ABDOMEN - 1 VIEW COMPARISON:  None. FINDINGS: Side port of the NG tube is in the stomach. Lung bases are clear. Bowel gas pattern is normal. IMPRESSION: Side port of the NG tube in the stomach. Electronically Signed  By: San Morelle M.D.   On: 01/26/2020 04:21   DG Abdomen 1 View  Result Date: 01/26/2020 CLINICAL DATA:  NG placement EXAM: ABDOMEN - 1 VIEW COMPARISON:  CT 01/26/2020, radiograph 01/18/2009 FINDINGS: The tip of the NG tube terminates in the lower thoracic esophagus. Lung bases are clear. Included cardiomediastinal contours are unremarkable. Mild gaseous distention of the stomach with few air-filled loops of small bowel in the upper abdomen, possibly reflective of early obstruction, better assessed on CT. Degenerative changes in the spine. IMPRESSION: The tip of the NG tube terminates in the lower thoracic esophagus. Tip of approximately 6 cm proximal to the GE junction. Recommend advancing approximately 10-15 cm and reimaging. Electronically Signed   By: Lovena Le M.D.   On: 01/26/2020 02:58   CT ABDOMEN PELVIS W CONTRAST  Result  Date: 01/26/2020 CLINICAL DATA:  Abdominal pain and vomiting. EXAM: CT ABDOMEN AND PELVIS WITH CONTRAST TECHNIQUE: Multidetector CT imaging of the abdomen and pelvis was performed using the standard protocol following bolus administration of intravenous contrast. CONTRAST:  145mL OMNIPAQUE IOHEXOL 300 MG/ML  SOLN COMPARISON:  None. FINDINGS: LOWER CHEST: Normal. HEPATOBILIARY: Normal hepatic contours. No intra- or extrahepatic biliary dilatation. The gallbladder is normal. PANCREAS: Normal pancreas. No ductal dilatation or peripancreatic fluid collection. SPLEEN: Multiple calcified granulomata. ADRENALS/URINARY TRACT: The adrenal glands are normal. No hydronephrosis, nephroureterolithiasis or solid renal mass. The urinary bladder is normal for degree of distention STOMACH/BOWEL: There is no hiatal hernia. Normal duodenal course and caliber. The proximal small bowel is fluid-filled and measures at the upper limits of normal for caliber. There is a transition to a smaller caliber in the right mid abdomen (axial image 43 and coronal image 24. Colon is decompressed. The appendix is not visualized. No right lower quadrant inflammation or free fluid. VASCULAR/LYMPHATIC: There is calcific atherosclerosis of the abdominal aorta. No abdominal or pelvic lymphadenopathy. REPRODUCTIVE: Prostate is enlarged and heterogeneous measuring 4.0 x 5.9 x 4.6 cm MUSCULOSKELETAL. No bony spinal canal stenosis or focal osseous abnormality. OTHER: None. IMPRESSION: 1. Proximal small bowel upper limits of normal in size with transition to smaller caliber in the right mid abdomen. This may indicate early obstruction. The appearance is similar to 08/29/19. 2. Enlarged, heterogeneous prostate gland. 3. Aortic Atherosclerosis (ICD10-I70.0). Electronically Signed   By: Ulyses Jarred M.D.   On: 01/26/2020 01:18     Scheduled Meds: . enoxaparin (LOVENOX) injection  40 mg Subcutaneous Q24H   Continuous Infusions: . sodium chloride 75 mL/hr at  01/26/20 0251     LOS: 1 day     Enzo Bi, MD [facility] If 7PM-7AM, please contact night-coverage 01/27/2020, 2:35 AM

## 2020-01-26 NOTE — ED Provider Notes (Signed)
[facility] Emergency Department Provider Note  ____________________________________________   First MD Initiated Contact with Patient 01/26/20 0007     (approximate)  I have reviewed the triage vital signs and the nursing notes.   HISTORY  Chief Complaint Abdominal Pain    HPI Christian Garcia is a 86 y.o. male with below list of previous medical conditions presents to the emergency department secondary to abdominal discomfort, bloating, nausea and vomiting.  Patient states that symptoms began yesterday and has progressively worsened since onset.  Patient denies any chest pain or shortness of breath.  Patient denies any blood noted in the diarrhea.        Past Medical History:  Diagnosis Date  . BPH (benign prostatic hypertrophy)   . Elevated PSA   . HTN (hypertension)   . Peyronie's disease   . Prostate cancer [facility])     Patient Active Problem List   Diagnosis Date Noted  . Prostate cancer ([place]) 05/31/2015  . Health care maintenance 04/11/2015  . Acquired hypothyroidism 03/31/2014  . HLD (hyperlipidemia) 03/31/2014  . PA (pernicious anemia) 03/31/2014    Past Surgical History:  Procedure Laterality Date  . APPENDECTOMY    . CATARACT EXTRACTION W/PHACO Left 03/24/2017   Procedure: CATARACT EXTRACTION PHACO AND INTRAOCULAR LENS PLACEMENT ([place]) LEFT;  Surgeon: Leandrew Koyanagi, MD;  Location: [place];  Service: Ophthalmology;  Laterality: Left;  . CATARACT EXTRACTION W/PHACO Right 05/05/2017   Procedure: CATARACT EXTRACTION PHACO AND INTRAOCULAR LENS PLACEMENT ([place]) RIGHT;  Surgeon: Leandrew Koyanagi, MD;  Location: [facility];  Service: Ophthalmology;  Laterality: Right;  . CRYOABLATION  2009   prostate  . FOOT SURGERY  right   ganglion cyst removed    Prior to Admission medications   Medication Sig Start Date End Date Taking? Authorizing Provider  Cyanocobalamin 1000 MCG/ML KIT Inject 1,000 mLs as directed.     [provider]  levothyroxine (SYNTHROID, LEVOTHROID) 50 MCG tablet Take by mouth. 10/13/16 08/14/19  [provider]    Allergies Sulfa antibiotics  Family History  Problem Relation Age of Onset  . Prostate cancer Brother        x 3  . Breast cancer Sister   . Kidney disease Neg Hx   . Kidney cancer Neg Hx   . Bladder Cancer Neg Hx     Social History Social History   Tobacco Use  . Smoking status: Never Smoker  . Smokeless tobacco: Never Used  Vaping Use  . Vaping Use: Never used  Substance Use Topics  . Alcohol use: No    Alcohol/week: 0.0 standard drinks  . Drug use: No    Review of Systems Constitutional: No fever/chills Eyes: No visual changes. ENT: No sore throat. Cardiovascular: Denies chest pain. Respiratory: Denies shortness of breath. Gastrointestinal: Positive for abdominal discomfort nausea and vomiting. Genitourinary: Negative for dysuria. Musculoskeletal: Negative for neck pain.  Negative for back pain. Integumentary: Negative for rash. Neurological: Negative for headaches, focal weakness or numbness.  ____________________________________________   PHYSICAL EXAM:  VITAL SIGNS: ED Triage Vitals  Enc Vitals Group     BP 01/25/20 2025 (!) 145/71     Pulse Rate 01/25/20 2025 85     Resp 01/25/20 2025 20     Temp 01/25/20 2025 (!) 100.5 F (38.1 C)     Temp Source 01/25/20 2025 Oral     SpO2 01/25/20 2025 96 %     Weight 01/25/20 2022 77.1 kg (170 lb)  Height 01/25/20 2022 1.778 m ('5\' 10"'$ )     Head Circumference --      Peak Flow --      Pain Score 01/25/20 2022 0     Pain Loc --      Pain Edu? --      Excl. in [place]? --     Constitutional: Alert and oriented.  Eyes: Conjunctivae are normal.  Mouth/Throat: Patient is wearing a mask. Neck: No stridor.  No meningeal signs.   Cardiovascular: Normal rate, regular rhythm. Good peripheral circulation. Grossly normal heart sounds. Respiratory: Normal respiratory effort.  No  retractions. Gastrointestinal: Generalized discomfort to palpation, distention, tympanic to percussion hyperactive bowel sounds Musculoskeletal: No lower extremity tenderness nor edema. No gross deformities of extremities. Neurologic:  Normal speech and language. No gross focal neurologic deficits are appreciated.  Skin:  Skin is warm, dry and intact. Psychiatric: Mood and affect are normal. Speech and behavior are normal.  ____________________________________________   LABS (all labs ordered are listed, but only abnormal results are displayed)  Labs Reviewed  COMPREHENSIVE METABOLIC PANEL - Abnormal; Notable for the following components:      Result Value   Glucose, Bld 138 (*)    Alkaline Phosphatase 37 (*)    GFR calc non Af Amer 56 (*)    All other components within normal limits  CBC - Abnormal; Notable for the following components:   HCT 38.8 (*)    All other components within normal limits  URINALYSIS, COMPLETE (UACMP) WITH MICROSCOPIC - Abnormal; Notable for the following components:   Color, Urine YELLOW (*)    APPearance HAZY (*)    Ketones, ur 5 (*)    All other components within normal limits  LIPASE, BLOOD   ____________________________________________  EKG  ED ECG REPORT I, [place] Zohar Maroney, the attending physician, personally viewed and interpreted this ECG.   Date: 01/25/2020  EKG Time: 8:30 PM  Rate: 80  Rhythm: Normal sinus rhythm  Axis: Normal  Intervals: Normal  ST&T Change: None  ____________________________________________  RADIOLOGY I, [place] N Lendora Keys, personally viewed and evaluated these images (plain radiographs) as part of my medical decision making, as well as reviewing the written report by the radiologist.  ED MD interpretation: Proximal small bowel upper limits of normal in size of transition small caliber in the right midabdomen.  Indicative of possible early obstruction per radiologist.  Official radiology report(s): CT ABDOMEN  PELVIS W CONTRAST  Result Date: 01/26/2020 CLINICAL DATA:  Abdominal pain and vomiting. EXAM: CT ABDOMEN AND PELVIS WITH CONTRAST TECHNIQUE: Multidetector CT imaging of the abdomen and pelvis was performed using the standard protocol following bolus administration of intravenous contrast. CONTRAST:  120m OMNIPAQUE IOHEXOL 300 MG/ML  SOLN COMPARISON:  None. FINDINGS: LOWER CHEST: Normal. HEPATOBILIARY: Normal hepatic contours. No intra- or extrahepatic biliary dilatation. The gallbladder is normal. PANCREAS: Normal pancreas. No ductal dilatation or peripancreatic fluid collection. SPLEEN: Multiple calcified granulomata. ADRENALS/URINARY TRACT: The adrenal glands are normal. No hydronephrosis, nephroureterolithiasis or solid renal mass. The urinary bladder is normal for degree of distention STOMACH/BOWEL: There is no hiatal hernia. Normal duodenal course and caliber. The proximal small bowel is fluid-filled and measures at the upper limits of normal for caliber. There is a transition to a smaller caliber in the right mid abdomen (axial image 43 and coronal image 24. Colon is decompressed. The appendix is not visualized. No right lower quadrant inflammation or free fluid. VASCULAR/LYMPHATIC: There is calcific atherosclerosis of the abdominal aorta. No abdominal or  pelvic lymphadenopathy. REPRODUCTIVE: Prostate is enlarged and heterogeneous measuring 4.0 x 5.9 x 4.6 cm MUSCULOSKELETAL. No bony spinal canal stenosis or focal osseous abnormality. OTHER: None. IMPRESSION: 1. Proximal small bowel upper limits of normal in size with transition to smaller caliber in the right mid abdomen. This may indicate early obstruction. The appearance is similar to 08/29/19. 2. Enlarged, heterogeneous prostate gland. 3. Aortic Atherosclerosis (ICD10-I70.0). Electronically Signed   By: Ulyses Jarred M.D.   On: 01/26/2020 01:18     Procedures   ____________________________________________   INITIAL IMPRESSION / MDM / ASSESSMENT  AND PLAN / ED COURSE  As part of my medical decision making, I reviewed the following data within the Protection NUMBER 87 year old male presented with above-stated history and physical exam a differential diagnosis including but not limited to bowel obstruction, ileus, colitis diverticulitis.  CT scan revealed evidence of a small bowel obstruction.  NG tube placed.  Patient discussed with Dr. Damita Dunnings for hospital admission further evaluation and management ____________________________________________  FINAL CLINICAL IMPRESSION(S) / ED DIAGNOSES  Final diagnoses:  Small bowel obstruction ([place])     MEDICATIONS GIVEN DURING THIS VISIT:  Medications  sodium chloride 0.9 % bolus 1,000 mL (0 mLs Intravenous Stopped 01/26/20 0134)  iohexol (OMNIPAQUE) 300 MG/ML solution 100 mL (100 mLs Intravenous Contrast Given 01/26/20 0034)     ED Discharge Orders    None      *Please note:  Christian Garcia was evaluated in Emergency Department on 01/26/2020 for the symptoms described in the history of present illness. He was evaluated in the context of the global COVID-19 pandemic, which necessitated consideration that the patient might be at risk for infection with the SARS-CoV-2 virus that causes COVID-19. Institutional protocols and algorithms that pertain to the evaluation of patients at risk for COVID-19 are in a state of rapid change based on information released by regulatory bodies including the CDC and federal and state organizations. These policies and algorithms were followed during the patient's care in the ED.  Some ED evaluations and interventions may be delayed as a result of limited staffing during and after the pandemic.*  Note:  This document was prepared using Dragon voice recognition software and may include unintentional dictation errors.   Gregor Hams, MD 01/26/20 [phone]

## 2020-01-26 NOTE — ED Notes (Signed)
Dr. Duncan at bedside 

## 2020-01-27 LAB — BASIC METABOLIC PANEL
Anion gap: 6 (ref 5–15)
BUN: 16 mg/dL (ref 8–23)
CO2: 26 mmol/L (ref 22–32)
Calcium: 8.1 mg/dL — ABNORMAL LOW (ref 8.9–10.3)
Chloride: 104 mmol/L (ref 98–111)
Creatinine, Ser: 1.09 mg/dL (ref 0.61–1.24)
GFR calc Af Amer: >60 mL/min (ref >60)
GFR calc non Af Amer: 60 mL/min — ABNORMAL LOW (ref >60)
Glucose, Bld: 140 mg/dL — ABNORMAL HIGH (ref 70–99)
Potassium: 4.1 mmol/L (ref 3.5–5.1)
Sodium: 136 mmol/L (ref 135–145)

## 2020-01-27 LAB — CBC
HCT: 34.7 % — ABNORMAL LOW (ref 39.0–52.0)
Hemoglobin: 11.5 g/dL — ABNORMAL LOW (ref 13.0–17.0)
MCH: 29.9 pg (ref 26.0–34.0)
MCHC: 33.1 g/dL (ref 30.0–36.0)
MCV: 90.1 fL (ref 80.0–100.0)
Platelets: 121 10*3/uL — ABNORMAL LOW (ref 150–400)
RBC: 3.85 MIL/uL — ABNORMAL LOW (ref 4.22–5.81)
RDW: 12.6 % (ref 11.5–15.5)
WBC: 4.5 10*3/uL (ref 4.0–10.5)
nRBC: 0 % (ref 0.0–0.2)

## 2020-01-27 LAB — MAGNESIUM: Magnesium: 1.9 mg/dL (ref 1.7–2.4)

## 2020-01-27 MED ORDER — LEVOTHYROXINE SODIUM 50 MCG PO TABS
75.0000 ug | ORAL_TABLET | Freq: Every day | ORAL | Status: DC
  Administered 2020-01-28: 75 ug via ORAL
  Filled 2020-01-27: qty 1

## 2020-01-27 NOTE — Plan of Care (Signed)

## 2020-01-27 NOTE — Progress Notes (Addendum)
PROGRESS NOTE    Christian Garcia  PTW:[id number] DOB: 08/19/1928 DOA: 01/25/2020 PCP: Kirk Ruths, MD    Assessment & Plan:   Principal Problem:   SBO (small bowel obstruction) ([facility]) Active Problems:   Acquired hypothyroidism   History of prostate cancer   History of small bowel obstruction   Small bowel obstruction (HCC)    Christian Garcia is a 86 y.o. Caucasian male with medical history significant for history acquired hypothyroidism, with history of small bowel obstruction in January 2021 that resolved spontaneously in the ER and discharged, who presents to the emergency room with similar symptoms to last small bowel obstruction consisting of abdominal pain and vomiting.  Patient reports generalized abdominal crampy pain of moderate intensity that developed in the last 24 hours, associated with a bloating sensation.  He later had an episode of nonbilious nonbloody nonfeculent vomitus.   SBO (small bowel obstruction) ([place]), resolved   History of small bowel obstruction on 08/29/2019 -Patient with abdominal pain, vomiting of food contents from several hours prior, not passing flatus and abdominal bloating -CT ABDOMEN PELVIS W CONTRAST  1. Proximal small bowel upper limits of normal in size with transition to smaller caliber in the right mid abdomen. This may indicate early obstruction. The appearance is similar to 08/29/19.  --NG tube placed in the ER to low intermittent suction, clamped yesterday evening, low residual, so started on clear liquid. PLAN: --Advance to Full liquid.  Can advance to regular tonight if pt does well. --d/c MIVF -Conservative management for now and consult surgery if not improving    Acquired hypothyroidism -resume home levothyroxine     History of prostate cancer -No acute concerns   DVT prophylaxis: Lovenox SQ Code Status: Full code  Family Communication:  Status is: changed to inpatient  Dispo:   The patient is from: home Anticipated  d/c is to: home Anticipated d/c date is: tomorrow Patient currently is not medically stable to d/c due to: On full liquid.  Will advance to regular diet, and if tolerated, then can discharge tomorrow.   Subjective and Interval History:  Yesterday, after NG tube clamp trial, gastric residue was minimum, so pt started on clear liquid overnight.    Pt accidentally pulled out NG tube, but has been tolerating clear liquid.  Reported abdominal pressure resolved.  No fever, dyspnea, chest pain, abdominal pain, N/V/D.  Had BM this morning.    Objective: Vitals:   01/26/20 1910 01/26/20 2354 01/27/20 0727 01/27/20 1520  BP: 121/63 128/64 124/62 130/70  Pulse: 63 62 61 (!) 59  Resp: 16 18 16 18   Temp: 98 F (36.7 C) 98.1 F (36.7 C) 98.1 F (36.7 C) 97.7 F (36.5 C)  TempSrc: Oral Oral Oral Oral  SpO2: 97% 95% 95% 99%  Weight:      Height:        Intake/Output Summary (Last 24 hours) at 01/27/2020 1531 Last data filed at 01/27/2020 0400 Gross per 24 hour  Intake 750 ml  Output 100 ml  Net 650 ml   Filed Weights   01/25/20 2022 01/26/20 0640  Weight: 77.1 kg 80.2 kg    Examination:   Constitutional: NAD, AAOx3 HEENT: conjunctivae and lids normal, EOMI CV: RRR no M,R,G. Distal pulses +2.  No cyanosis.   RESP: CTA B/L, normal respiratory effort  GI: +BS, NTND.   Extremities: No effusions, edema, or tenderness in BLE SKIN: warm, dry and intact Neuro: II - XII grossly intact.  Sensation intact Psych:  Normal mood and affect.  Appropriate judgement and reason   Data Reviewed: I have personally reviewed following labs and imaging studies  CBC: Recent Labs  Lab 01/25/20 2029 01/27/20 1009  WBC 8.6 4.5  HGB 13.0 11.5*  HCT 38.8* 34.7*  MCV 87.2 90.1  PLT 154 315*   Basic Metabolic Panel: Recent Labs  Lab 01/25/20 2029 01/27/20 1009  NA 138 136  K 5.0 4.1  CL 101 104  CO2 26 26  GLUCOSE 138* 140*  BUN 20 16  CREATININE 1.15 1.09  CALCIUM 9.2 8.1*  MG  --  1.9     GFR: Estimated Creatinine Clearance: 47.4 mL/min (by C-G formula based on SCr of 1.09 mg/dL). Liver Function Tests: Recent Labs  Lab 01/25/20 2029  AST 22  ALT 14  ALKPHOS 37*  BILITOT 0.9  PROT 7.0  ALBUMIN 4.0   Recent Labs  Lab 01/25/20 2029  LIPASE 27   No results for input(s): AMMONIA in the last 168 hours. Coagulation Profile: No results for input(s): INR, PROTIME in the last 168 hours. Cardiac Enzymes: No results for input(s): CKTOTAL, CKMB, CKMBINDEX, TROPONINI in the last 168 hours. BNP (last 3 results) No results for input(s): PROBNP in the last 8760 hours. HbA1C: No results for input(s): HGBA1C in the last 72 hours. CBG: No results for input(s): GLUCAP in the last 168 hours. Lipid Profile: No results for input(s): CHOL, HDL, LDLCALC, TRIG, CHOLHDL, LDLDIRECT in the last 72 hours. Thyroid Function Tests: No results for input(s): TSH, T4TOTAL, FREET4, T3FREE, THYROIDAB in the last 72 hours. Anemia Panel: No results for input(s): VITAMINB12, FOLATE, FERRITIN, TIBC, IRON, RETICCTPCT in the last 72 hours. Sepsis Labs: No results for input(s): PROCALCITON, LATICACIDVEN in the last 168 hours.  Recent Results (from the past 240 hour(s))  SARS Coronavirus 2 by RT PCR (hospital order, performed in [facility] hospital lab) Nasopharyngeal Nasopharyngeal Swab     Status: None   Collection Time: 01/26/20  2:30 AM   Specimen: Nasopharyngeal Swab  Result Value Ref Range Status   SARS Coronavirus 2 NEGATIVE NEGATIVE Final    Comment: (NOTE) SARS-CoV-2 target nucleic acids are NOT DETECTED.  The SARS-CoV-2 RNA is generally detectable in upper and lower respiratory specimens during the acute phase of infection. The lowest concentration of SARS-CoV-2 viral copies this assay can detect is 250 copies / mL. A negative result does not preclude SARS-CoV-2 infection and should not be used as the sole basis for treatment or other patient management decisions.  A negative  result may occur with improper specimen collection / handling, submission of specimen other than nasopharyngeal swab, presence of viral mutation(s) within the areas targeted by this assay, and inadequate number of viral copies (<250 copies / mL). A negative result must be combined with clinical observations, patient history, and epidemiological information.  Fact Sheet for Patients:   StrictlyIdeas.no  Fact Sheet for Healthcare Providers: BankingDealers.co.za  This test is not yet approved or  cleared by the [place] FDA and has been authorized for detection and/or diagnosis of SARS-CoV-2 by FDA under an Emergency Use Authorization (EUA).  This EUA will remain in effect (meaning this test can be used) for the duration of the COVID-19 declaration under Section 564(b)(1) of the Act, 21 U.S.C. section 360bbb-3(b)(1), unless the authorization is terminated or revoked sooner.  Performed at [facility], [address], [place], [place] [address]       Radiology Studies: DG Abdomen 1 View  Result Date: 01/26/2020  CLINICAL DATA:  NG tube placement. EXAM: ABDOMEN - 1 VIEW COMPARISON:  None. FINDINGS: Side port of the NG tube is in the stomach. Lung bases are clear. Bowel gas pattern is normal. IMPRESSION: Side port of the NG tube in the stomach. Electronically Signed   By: San Morelle M.D.   On: 01/26/2020 04:21   DG Abdomen 1 View  Result Date: 01/26/2020 CLINICAL DATA:  NG placement EXAM: ABDOMEN - 1 VIEW COMPARISON:  CT 01/26/2020, radiograph 01/18/2009 FINDINGS: The tip of the NG tube terminates in the lower thoracic esophagus. Lung bases are clear. Included cardiomediastinal contours are unremarkable. Mild gaseous distention of the stomach with few air-filled loops of small bowel in the upper abdomen, possibly reflective of early obstruction, better assessed on CT. Degenerative changes in the spine. IMPRESSION: The  tip of the NG tube terminates in the lower thoracic esophagus. Tip of approximately 6 cm proximal to the GE junction. Recommend advancing approximately 10-15 cm and reimaging. Electronically Signed   By: Lovena Le M.D.   On: 01/26/2020 02:58   CT ABDOMEN PELVIS W CONTRAST  Result Date: 01/26/2020 CLINICAL DATA:  Abdominal pain and vomiting. EXAM: CT ABDOMEN AND PELVIS WITH CONTRAST TECHNIQUE: Multidetector CT imaging of the abdomen and pelvis was performed using the standard protocol following bolus administration of intravenous contrast. CONTRAST:  174mL OMNIPAQUE IOHEXOL 300 MG/ML  SOLN COMPARISON:  None. FINDINGS: LOWER CHEST: Normal. HEPATOBILIARY: Normal hepatic contours. No intra- or extrahepatic biliary dilatation. The gallbladder is normal. PANCREAS: Normal pancreas. No ductal dilatation or peripancreatic fluid collection. SPLEEN: Multiple calcified granulomata. ADRENALS/URINARY TRACT: The adrenal glands are normal. No hydronephrosis, nephroureterolithiasis or solid renal mass. The urinary bladder is normal for degree of distention STOMACH/BOWEL: There is no hiatal hernia. Normal duodenal course and caliber. The proximal small bowel is fluid-filled and measures at the upper limits of normal for caliber. There is a transition to a smaller caliber in the right mid abdomen (axial image 43 and coronal image 24. Colon is decompressed. The appendix is not visualized. No right lower quadrant inflammation or free fluid. VASCULAR/LYMPHATIC: There is calcific atherosclerosis of the abdominal aorta. No abdominal or pelvic lymphadenopathy. REPRODUCTIVE: Prostate is enlarged and heterogeneous measuring 4.0 x 5.9 x 4.6 cm MUSCULOSKELETAL. No bony spinal canal stenosis or focal osseous abnormality. OTHER: None. IMPRESSION: 1. Proximal small bowel upper limits of normal in size with transition to smaller caliber in the right mid abdomen. This may indicate early obstruction. The appearance is similar to 08/29/19. 2.  Enlarged, heterogeneous prostate gland. 3. Aortic Atherosclerosis (ICD10-I70.0). Electronically Signed   By: Ulyses Jarred M.D.   On: 01/26/2020 01:18     Scheduled Meds:  enoxaparin (LOVENOX) injection  40 mg Subcutaneous Q24H   Continuous Infusions:  sodium chloride 75 mL/hr at 01/27/20 0441     LOS: 1 day     Enzo Bi, MD [facility] If 7PM-7AM, please contact night-coverage 01/27/2020, 3:31 PM

## 2020-01-28 LAB — BASIC METABOLIC PANEL
Anion gap: 8 (ref 5–15)
BUN: 14 mg/dL (ref 8–23)
CO2: 27 mmol/L (ref 22–32)
Calcium: 8.8 mg/dL — ABNORMAL LOW (ref 8.9–10.3)
Chloride: 102 mmol/L (ref 98–111)
Creatinine, Ser: 1.19 mg/dL (ref 0.61–1.24)
GFR calc Af Amer: >60 mL/min (ref >60)
GFR calc non Af Amer: 54 mL/min — ABNORMAL LOW (ref >60)
Glucose, Bld: 110 mg/dL — ABNORMAL HIGH (ref 70–99)
Potassium: 3.9 mmol/L (ref 3.5–5.1)
Sodium: 137 mmol/L (ref 135–145)

## 2020-01-28 LAB — CBC
HCT: 37.5 % — ABNORMAL LOW (ref 39.0–52.0)
Hemoglobin: 12.6 g/dL — ABNORMAL LOW (ref 13.0–17.0)
MCH: 29.6 pg (ref 26.0–34.0)
MCHC: 33.6 g/dL (ref 30.0–36.0)
MCV: 88.2 fL (ref 80.0–100.0)
Platelets: 141 10*3/uL — ABNORMAL LOW (ref 150–400)
RBC: 4.25 MIL/uL (ref 4.22–5.81)
RDW: 12.5 % (ref 11.5–15.5)
WBC: 4.5 10*3/uL (ref 4.0–10.5)
nRBC: 0 % (ref 0.0–0.2)

## 2020-01-28 LAB — MAGNESIUM: Magnesium: 2 mg/dL (ref 1.7–2.4)

## 2020-01-28 NOTE — Discharge Summary (Signed)
Physician Discharge Summary   Christian Garcia  male DOB: Feb 13, 1929  ZTI:[id number]  PCP: Kirk Ruths, MD  Admit date: 01/25/2020 Discharge date: 01/28/2020  Admitted From: home Disposition:  Home.  Patient walked around unit (with nurse) several times with no distress.  Patient drove himself home.   CODE STATUS: Full code     Hospital Course:  For full details, please see H&P, progress notes, consult notes and ancillary notes.  Briefly,  Christian Paulding Albrightis a 86 y.o.Caucasian malewith medical history significant acquired hypothyroidism, history of small bowel obstruction in January 2021 that resolved spontaneously in the ER and discharged, who presented to the emergency room with similar symptoms to last small bowel obstruction consisting of abdominal pain and vomiting.   SBO (small bowel obstruction) ([place]), resolved History of small bowel obstructionon 08/29/2019 Patient with abdominal pain, vomiting of food contents from several hours prior to presentation, not passing flatus and abdominal bloating. CT ABDOMEN PELVIS W CONTRAST showed "Proximal small bowel upper limits of normal in size with transition to smaller caliber in the right mid abdomen. This may indicate early obstruction."  NG tube placed in the ER to low intermittent suction.  Pt's symptoms improved, so NG tube clamped on 6/26, and later found to have low residual, so started on clear liquid.  Diet was advanced to Full liquid and then regular, and pt tolerated it well, so was discharged.  Acquired hypothyroidism Resumed home levothyroxine after starting oral intake.  History of prostate cancer No acute concerns   Discharge Diagnoses:  Principal Problem:   SBO (small bowel obstruction) (HCC) Active Problems:   Acquired hypothyroidism   History of prostate cancer   History of small bowel obstruction   Small bowel obstruction [facility])    Discharge Instructions:  Allergies as of 01/28/2020       Reactions   Sulfa Antibiotics Other (See Comments)   Red skin      Medication List    TAKE these medications   acidophilus Caps capsule Take 1 capsule by mouth at bedtime.   Cyanocobalamin 1000 MCG/ML Kit Inject 1,000 mcg into the muscle every 30 (thirty) days.   levothyroxine 75 MCG tablet Commonly known as: SYNTHROID Take 75 mcg by mouth daily before breakfast.        Follow-up Information    Kirk Ruths, MD. Schedule an appointment as soon as possible for a visit in 1 week(s).   Specialty: Internal Medicine Contact information: [place] [address] [phone]               Allergies  Allergen Reactions   Sulfa Antibiotics Other (See Comments)    Red skin     The results of significant diagnostics from this hospitalization (including imaging, microbiology, ancillary and laboratory) are listed below for reference.   Consultations:   Procedures/Studies: DG Abdomen 1 View  Result Date: 01/26/2020 CLINICAL DATA:  NG tube placement. EXAM: ABDOMEN - 1 VIEW COMPARISON:  None. FINDINGS: Side port of the NG tube is in the stomach. Lung bases are clear. Bowel gas pattern is normal. IMPRESSION: Side port of the NG tube in the stomach. Electronically Signed   By: San Morelle M.D.   On: 01/26/2020 04:21   DG Abdomen 1 View  Result Date: 01/26/2020 CLINICAL DATA:  NG placement EXAM: ABDOMEN - 1 VIEW COMPARISON:  CT 01/26/2020, radiograph 01/18/2009 FINDINGS: The tip of the NG tube terminates in the lower thoracic  esophagus. Lung bases are clear. Included cardiomediastinal contours are unremarkable. Mild gaseous distention of the stomach with few air-filled loops of small bowel in the upper abdomen, possibly reflective of early obstruction, better assessed on CT. Degenerative changes in the spine. IMPRESSION: The tip of the NG tube terminates in the lower thoracic esophagus. Tip of approximately 6 cm  proximal to the GE junction. Recommend advancing approximately 10-15 cm and reimaging. Electronically Signed   By: Lovena Le M.D.   On: 01/26/2020 02:58   CT ABDOMEN PELVIS W CONTRAST  Result Date: 01/26/2020 CLINICAL DATA:  Abdominal pain and vomiting. EXAM: CT ABDOMEN AND PELVIS WITH CONTRAST TECHNIQUE: Multidetector CT imaging of the abdomen and pelvis was performed using the standard protocol following bolus administration of intravenous contrast. CONTRAST:  16m OMNIPAQUE IOHEXOL 300 MG/ML  SOLN COMPARISON:  None. FINDINGS: LOWER CHEST: Normal. HEPATOBILIARY: Normal hepatic contours. No intra- or extrahepatic biliary dilatation. The gallbladder is normal. PANCREAS: Normal pancreas. No ductal dilatation or peripancreatic fluid collection. SPLEEN: Multiple calcified granulomata. ADRENALS/URINARY TRACT: The adrenal glands are normal. No hydronephrosis, nephroureterolithiasis or solid renal mass. The urinary bladder is normal for degree of distention STOMACH/BOWEL: There is no hiatal hernia. Normal duodenal course and caliber. The proximal small bowel is fluid-filled and measures at the upper limits of normal for caliber. There is a transition to a smaller caliber in the right mid abdomen (axial image 43 and coronal image 24. Colon is decompressed. The appendix is not visualized. No right lower quadrant inflammation or free fluid. VASCULAR/LYMPHATIC: There is calcific atherosclerosis of the abdominal aorta. No abdominal or pelvic lymphadenopathy. REPRODUCTIVE: Prostate is enlarged and heterogeneous measuring 4.0 x 5.9 x 4.6 cm MUSCULOSKELETAL. No bony spinal canal stenosis or focal osseous abnormality. OTHER: None. IMPRESSION: 1. Proximal small bowel upper limits of normal in size with transition to smaller caliber in the right mid abdomen. This may indicate early obstruction. The appearance is similar to 08/29/19. 2. Enlarged, heterogeneous prostate gland. 3. Aortic Atherosclerosis (ICD10-I70.0).  Electronically Signed   By: KUlyses JarredM.D.   On: 01/26/2020 01:18      Labs: BNP (last 3 results) No results for input(s): BNP in the last 8760 hours. Basic Metabolic Panel: Recent Labs  Lab 01/25/20 2029 01/27/20 1009 01/28/20 0617  NA 138 136 137  K 5.0 4.1 3.9  CL 101 104 102  CO2 '26 26 27  '$ GLUCOSE 138* 140* 110*  BUN '20 16 14  '$ CREATININE 1.15 1.09 1.19  CALCIUM 9.2 8.1* 8.8*  MG  --  1.9 2.0   Liver Function Tests: Recent Labs  Lab 01/25/20 2029  AST 22  ALT 14  ALKPHOS 37*  BILITOT 0.9  PROT 7.0  ALBUMIN 4.0   Recent Labs  Lab 01/25/20 2029  LIPASE 27   No results for input(s): AMMONIA in the last 168 hours. CBC: Recent Labs  Lab 01/25/20 2029 01/27/20 1009 01/28/20 0617  WBC 8.6 4.5 4.5  HGB 13.0 11.5* 12.6*  HCT 38.8* 34.7* 37.5*  MCV 87.2 90.1 88.2  PLT 154 121* 141*   Cardiac Enzymes: No results for input(s): CKTOTAL, CKMB, CKMBINDEX, TROPONINI in the last 168 hours. BNP: Invalid input(s): POCBNP CBG: No results for input(s): GLUCAP in the last 168 hours. D-Dimer No results for input(s): DDIMER in the last 72 hours. Hgb A1c No results for input(s): HGBA1C in the last 72 hours. Lipid Profile No results for input(s): CHOL, HDL, LDLCALC, TRIG, CHOLHDL, LDLDIRECT in the last 72 hours. Thyroid function studies  No results for input(s): TSH, T4TOTAL, T3FREE, THYROIDAB in the last 72 hours.  Invalid input(s): FREET3 Anemia work up No results for input(s): VITAMINB12, FOLATE, FERRITIN, TIBC, IRON, RETICCTPCT in the last 72 hours. Urinalysis    Component Value Date/Time   COLORURINE YELLOW (A) 01/25/2020 2029   APPEARANCEUR HAZY (A) 01/25/2020 2029   LABSPEC 1.023 01/25/2020 2029   PHURINE 5.0 01/25/2020 2029   GLUCOSEU NEGATIVE 01/25/2020 2029   HGBUR NEGATIVE 01/25/2020 2029   BILIRUBINUR NEGATIVE 01/25/2020 2029   KETONESUR 5 (A) 01/25/2020 2029   PROTEINUR NEGATIVE 01/25/2020 2029   UROBILINOGEN 0.2 01/18/2009 1926   NITRITE  NEGATIVE 01/25/2020 2029   LEUKOCYTESUR NEGATIVE 01/25/2020 2029   Sepsis Labs Invalid input(s): PROCALCITONIN,  WBC,  LACTICIDVEN Microbiology Recent Results (from the past 240 hour(s))  SARS Coronavirus 2 by RT PCR (hospital order, performed in [place] hospital lab) Nasopharyngeal Nasopharyngeal Swab     Status: None   Collection Time: 01/26/20  2:30 AM   Specimen: Nasopharyngeal Swab  Result Value Ref Range Status   SARS Coronavirus 2 NEGATIVE NEGATIVE Final    Comment: (NOTE) SARS-CoV-2 target nucleic acids are NOT DETECTED.  The SARS-CoV-2 RNA is generally detectable in upper and lower respiratory specimens during the acute phase of infection. The lowest concentration of SARS-CoV-2 viral copies this assay can detect is 250 copies / mL. A negative result does not preclude SARS-CoV-2 infection and should not be used as the sole basis for treatment or other patient management decisions.  A negative result may occur with improper specimen collection / handling, submission of specimen other than nasopharyngeal swab, presence of viral mutation(s) within the areas targeted by this assay, and inadequate number of viral copies (<250 copies / mL). A negative result must be combined with clinical observations, patient history, and epidemiological information.  Fact Sheet for Patients:   StrictlyIdeas.no  Fact Sheet for Healthcare Providers: BankingDealers.co.za  This test is not yet approved or  cleared by the [place] FDA and has been authorized for detection and/or diagnosis of SARS-CoV-2 by FDA under an Emergency Use Authorization (EUA).  This EUA will remain in effect (meaning this test can be used) for the duration of the COVID-19 declaration under Section 564(b)(1) of the Act, 21 U.S.C. section 360bbb-3(b)(1), unless the authorization is terminated or revoked sooner.  Performed at [facility], [address] [place], [place] [address]      Total time spend on discharging this patient, including the last patient exam, discussing the hospital stay, instructions for ongoing care as it relates to all pertinent caregivers, as well as preparing the medical discharge records, prescriptions, and/or referrals as applicable, is 35 minutes.    Enzo Bi, MD  [facility] 01/28/2020, 10:47 AM  If 7PM-7AM, please contact night-coverage

## 2020-01-28 NOTE — Progress Notes (Signed)
Patient is being discharged home this morning. Patient walked around unit (with nurse) several times with no distress.  DC instructions given and patient acknowledged understanding. IV removed and patient packed belongings and dressed himself. Patient has personal vehicle and he will drive himself home. NT will take patient out.

## 2020-01-29 DIAGNOSIS — D510 Vitamin B12 deficiency anemia due to intrinsic factor deficiency: Secondary | ICD-10-CM | POA: Diagnosis not present

## 2020-02-07 DIAGNOSIS — Z8719 Personal history of other diseases of the digestive system: Secondary | ICD-10-CM | POA: Diagnosis not present

## 2020-02-07 DIAGNOSIS — Z09 Encounter for follow-up examination after completed treatment for conditions other than malignant neoplasm: Secondary | ICD-10-CM | POA: Diagnosis not present

## 2020-02-07 DIAGNOSIS — N1831 Chronic kidney disease, stage 3a: Secondary | ICD-10-CM | POA: Diagnosis not present

## 2020-02-08 DIAGNOSIS — K5904 Chronic idiopathic constipation: Secondary | ICD-10-CM | POA: Diagnosis not present

## 2020-02-12 ENCOUNTER — Ambulatory Visit: Payer: PPO | Admitting: Urology

## 2020-02-19 ENCOUNTER — Ambulatory Visit: Payer: PPO | Admitting: Urology

## 2020-02-20 ENCOUNTER — Other Ambulatory Visit (HOSPITAL_COMMUNITY): Payer: Self-pay | Admitting: Gastroenterology

## 2020-02-20 ENCOUNTER — Other Ambulatory Visit: Payer: Self-pay | Admitting: Gastroenterology

## 2020-02-20 DIAGNOSIS — Z1211 Encounter for screening for malignant neoplasm of colon: Secondary | ICD-10-CM | POA: Diagnosis not present

## 2020-02-20 DIAGNOSIS — Z8639 Personal history of other endocrine, nutritional and metabolic disease: Secondary | ICD-10-CM | POA: Diagnosis not present

## 2020-02-20 DIAGNOSIS — Z8719 Personal history of other diseases of the digestive system: Secondary | ICD-10-CM | POA: Diagnosis not present

## 2020-02-20 DIAGNOSIS — R194 Change in bowel habit: Secondary | ICD-10-CM | POA: Diagnosis not present

## 2020-02-22 ENCOUNTER — Other Ambulatory Visit
Admission: RE | Admit: 2020-02-22 | Discharge: 2020-02-22 | Disposition: A | Discharge status: Home / Self Care | Payer: PPO | Source: Ambulatory Visit | Attending: Gastroenterology | Admitting: Gastroenterology

## 2020-02-22 ENCOUNTER — Other Ambulatory Visit: Payer: Self-pay

## 2020-02-22 DIAGNOSIS — Z01812 Encounter for preprocedural laboratory examination: Secondary | ICD-10-CM | POA: Diagnosis not present

## 2020-02-22 DIAGNOSIS — Z20822 Contact with and (suspected) exposure to covid-19: Secondary | ICD-10-CM | POA: Insufficient documentation

## 2020-02-22 LAB — SARS CORONAVIRUS 2 (TAT 6-24 HRS): SARS Coronavirus 2: NEGATIVE

## 2020-02-23 ENCOUNTER — Encounter: Payer: Self-pay | Admitting: *Deleted

## 2020-02-26 ENCOUNTER — Encounter
Admission: RE | Disposition: A | Discharge status: Home / Self Care | Payer: Self-pay | Source: Home / Self Care | Attending: Gastroenterology

## 2020-02-26 ENCOUNTER — Other Ambulatory Visit: Payer: Self-pay

## 2020-02-26 ENCOUNTER — Encounter: Payer: Self-pay | Admitting: *Deleted

## 2020-02-26 ENCOUNTER — Ambulatory Visit
Admission: RE | Admit: 2020-02-26 | Discharge: 2020-02-26 | Disposition: A | Discharge status: Home / Self Care | Payer: PPO | Attending: Gastroenterology | Admitting: Gastroenterology

## 2020-02-26 ENCOUNTER — Ambulatory Visit: Payer: PPO | Admitting: Certified Registered Nurse Anesthetist

## 2020-02-26 DIAGNOSIS — D510 Vitamin B12 deficiency anemia due to intrinsic factor deficiency: Secondary | ICD-10-CM | POA: Insufficient documentation

## 2020-02-26 DIAGNOSIS — D120 Benign neoplasm of cecum: Secondary | ICD-10-CM | POA: Diagnosis not present

## 2020-02-26 DIAGNOSIS — N400 Enlarged prostate without lower urinary tract symptoms: Secondary | ICD-10-CM | POA: Diagnosis not present

## 2020-02-26 DIAGNOSIS — Z862 Personal history of diseases of the blood and blood-forming organs and certain disorders involving the immune mechanism: Secondary | ICD-10-CM | POA: Diagnosis not present

## 2020-02-26 DIAGNOSIS — K2950 Unspecified chronic gastritis without bleeding: Secondary | ICD-10-CM | POA: Insufficient documentation

## 2020-02-26 DIAGNOSIS — Q402 Other specified congenital malformations of stomach: Secondary | ICD-10-CM | POA: Diagnosis not present

## 2020-02-26 DIAGNOSIS — K635 Polyp of colon: Secondary | ICD-10-CM | POA: Diagnosis not present

## 2020-02-26 DIAGNOSIS — Z8546 Personal history of malignant neoplasm of prostate: Secondary | ICD-10-CM | POA: Insufficient documentation

## 2020-02-26 DIAGNOSIS — Z882 Allergy status to sulfonamides status: Secondary | ICD-10-CM | POA: Insufficient documentation

## 2020-02-26 DIAGNOSIS — E039 Hypothyroidism, unspecified: Secondary | ICD-10-CM | POA: Insufficient documentation

## 2020-02-26 DIAGNOSIS — K2100 Gastro-esophageal reflux disease with esophagitis, without bleeding: Secondary | ICD-10-CM | POA: Diagnosis not present

## 2020-02-26 DIAGNOSIS — D122 Benign neoplasm of ascending colon: Secondary | ICD-10-CM | POA: Diagnosis not present

## 2020-02-26 DIAGNOSIS — K3189 Other diseases of stomach and duodenum: Secondary | ICD-10-CM | POA: Diagnosis not present

## 2020-02-26 DIAGNOSIS — Z79899 Other long term (current) drug therapy: Secondary | ICD-10-CM | POA: Insufficient documentation

## 2020-02-26 DIAGNOSIS — R194 Change in bowel habit: Secondary | ICD-10-CM | POA: Insufficient documentation

## 2020-02-26 DIAGNOSIS — K2930 Chronic superficial gastritis without bleeding: Secondary | ICD-10-CM | POA: Diagnosis not present

## 2020-02-26 DIAGNOSIS — K449 Diaphragmatic hernia without obstruction or gangrene: Secondary | ICD-10-CM | POA: Insufficient documentation

## 2020-02-26 DIAGNOSIS — K2980 Duodenitis without bleeding: Secondary | ICD-10-CM | POA: Diagnosis not present

## 2020-02-26 DIAGNOSIS — C189 Malignant neoplasm of colon, unspecified: Secondary | ICD-10-CM | POA: Diagnosis not present

## 2020-02-26 DIAGNOSIS — J449 Chronic obstructive pulmonary disease, unspecified: Secondary | ICD-10-CM | POA: Insufficient documentation

## 2020-02-26 DIAGNOSIS — K317 Polyp of stomach and duodenum: Secondary | ICD-10-CM | POA: Diagnosis not present

## 2020-02-26 DIAGNOSIS — E785 Hyperlipidemia, unspecified: Secondary | ICD-10-CM | POA: Diagnosis not present

## 2020-02-26 DIAGNOSIS — K209 Esophagitis, unspecified without bleeding: Secondary | ICD-10-CM | POA: Diagnosis not present

## 2020-02-26 DIAGNOSIS — N486 Induration penis plastica: Secondary | ICD-10-CM | POA: Insufficient documentation

## 2020-02-26 HISTORY — DX: Change in bowel habit: R19.4

## 2020-02-26 HISTORY — PX: ESOPHAGOGASTRODUODENOSCOPY (EGD) WITH PROPOFOL: SHX5813

## 2020-02-26 HISTORY — DX: Chronic obstructive pulmonary disease, unspecified: J44.9

## 2020-02-26 HISTORY — DX: Hypothyroidism, unspecified: E03.9

## 2020-02-26 HISTORY — DX: Personal history of other diseases of the digestive system: Z87.19

## 2020-02-26 HISTORY — PX: COLONOSCOPY WITH PROPOFOL: SHX5780

## 2020-02-26 HISTORY — DX: Anemia, unspecified: D64.9

## 2020-02-26 SURGERY — COLONOSCOPY WITH PROPOFOL
Anesthesia: General

## 2020-02-26 MED ORDER — SODIUM CHLORIDE 0.9 % IV SOLN
INTRAVENOUS | Status: DC | PRN
Start: 2020-02-26 — End: 2020-02-26

## 2020-02-26 MED ORDER — SODIUM CHLORIDE 0.9 % IV SOLN: INTRAVENOUS | Status: DC

## 2020-02-26 MED ORDER — PROPOFOL 500 MG/50ML IV EMUL
INTRAVENOUS | Status: DC | PRN
  Administered 2020-02-26: 150 ug/kg/min via INTRAVENOUS

## 2020-02-26 MED ORDER — PROPOFOL 10 MG/ML IV BOLUS
INTRAVENOUS | Status: DC | PRN
  Administered 2020-02-26: 50 mg via INTRAVENOUS

## 2020-02-26 MED ORDER — GLYCOPYRROLATE 0.2 MG/ML IJ SOLN
INTRAMUSCULAR | Status: AC
  Filled 2020-02-26: qty 1

## 2020-02-26 MED ORDER — PROPOFOL 500 MG/50ML IV EMUL
INTRAVENOUS | Status: AC
  Filled 2020-02-26: qty 50

## 2020-02-26 MED ORDER — EPHEDRINE 5 MG/ML INJ
INTRAVENOUS | Status: AC
  Filled 2020-02-26: qty 10

## 2020-02-26 MED ORDER — PHENYLEPHRINE HCL (PRESSORS) 10 MG/ML IV SOLN
INTRAVENOUS | Status: DC | PRN
  Administered 2020-02-26 (×3): 100 ug via INTRAVENOUS

## 2020-02-26 MED ORDER — EPHEDRINE SULFATE 50 MG/ML IJ SOLN
INTRAMUSCULAR | Status: DC | PRN
Start: 2020-02-26 — End: 2020-02-26
  Administered 2020-02-26: 10 mg via INTRAVENOUS

## 2020-02-26 NOTE — Anesthesia Postprocedure Evaluation (Signed)
Anesthesia Post Note  Patient: Christian Garcia  Procedure(s) Performed: COLONOSCOPY WITH PROPOFOL (N/A ) ESOPHAGOGASTRODUODENOSCOPY (EGD) WITH PROPOFOL (N/A )  Patient location during evaluation: PACU Anesthesia Type: General Level of consciousness: awake and alert Pain management: pain level controlled Vital Signs Assessment: post-procedure vital signs reviewed and stable Respiratory status: spontaneous breathing, nonlabored ventilation and respiratory function stable Cardiovascular status: blood pressure returned to baseline and stable Postop Assessment: no apparent nausea or vomiting Anesthetic complications: no   No complications documented.   Last Vitals:  Vitals:   02/26/20 1137 02/26/20 1143  BP: (!) 133/73   Pulse: 67 65  Resp: 15 16  Temp:    SpO2: 100% 100%    Last Pain:  Vitals:   02/26/20 1137  TempSrc:   PainSc: 0-No pain                 Brett Canales Maisa Bedingfield

## 2020-02-26 NOTE — Anesthesia Preprocedure Evaluation (Signed)
Anesthesia Evaluation  Patient identified by MRN, date of birth, ID band Patient awake    Reviewed: Allergy & Precautions, H&P , NPO status , Patient's Chart, lab work & pertinent test results  Airway Mallampati: II  TM Distance: >3 FB Neck ROM: full    Dental   Pulmonary COPD,    Pulmonary exam normal        Cardiovascular negative cardio ROS Normal cardiovascular exam     Neuro/Psych negative neurological ROS  negative psych ROS   GI/Hepatic negative GI ROS, Neg liver ROS,   Endo/Other  Hypothyroidism   Renal/GU negative Renal ROS  negative genitourinary   Musculoskeletal   Abdominal   Peds  Hematology  (+) Blood dyscrasia, anemia ,   Anesthesia Other Findings Past Medical History: No date: Altered bowel habits No date: Anemia No date: BPH (benign prostatic hypertrophy) No date: COPD (chronic obstructive pulmonary disease) (HCC) No date: Elevated PSA No date: History of small bowel obstruction No date: Hypothyroidism No date: Peyronie's disease No date: Prostate cancer [facility])  Past Surgical History: No date: APPENDECTOMY 03/24/2017: CATARACT EXTRACTION W/PHACO; Left     Comment:  Procedure: CATARACT EXTRACTION PHACO AND INTRAOCULAR               LENS PLACEMENT ([place]) LEFT;  Surgeon: Leandrew Koyanagi, MD;  Location: [place];  Service:               Ophthalmology;  Laterality: Left; 05/05/2017: CATARACT EXTRACTION W/PHACO; Right     Comment:  Procedure: CATARACT EXTRACTION PHACO AND INTRAOCULAR               LENS PLACEMENT ([place]) RIGHT;  Surgeon: Leandrew Koyanagi, MD;  Location: [place];  Service:               Ophthalmology;  Laterality: Right; 2009: CRYOABLATION     Comment:  prostate right: FOOT SURGERY     Comment:  ganglion cyst removed No date: PROSTATE SURGERY  BMI    Body Mass Index: 24.25 kg/m      Reproductive/Obstetrics negative  OB ROS                            Anesthesia Physical Anesthesia Plan  ASA: III  Anesthesia Plan: General   Post-op Pain Management:    Induction:   PONV Risk Score and Plan: Propofol infusion and TIVA  Airway Management Planned: Nasal Cannula  Additional Equipment:   Intra-op Plan:   Post-operative Plan:   Informed Consent: I have reviewed the patients History and Physical, chart, labs and discussed the procedure including the risks, benefits and alternatives for the proposed anesthesia with the patient or authorized representative who has indicated his/her understanding and acceptance.     Dental Advisory Given  Plan Discussed with: Anesthesiologist, CRNA and Surgeon  Anesthesia Plan Comments:        Anesthesia Quick Evaluation

## 2020-02-26 NOTE — Op Note (Signed)
[facility] Gastroenterology Patient Name: Christian Garcia Procedure Date: 02/26/2020 10:13 AM MRN: [id number] Account #: [id number] Date of Birth: 06-12-1929 Admit Type: Outpatient Age: 86 Room: [facility] ENDO ROOM 4 Gender: Male Note Status: Finalized Procedure:             Upper GI endoscopy Indications:           Pernicious anemia Providers:             Andrey Farmer MD, MD Referring MD:          Ocie Cornfield. Ouida Sills MD, MD (Referring MD) Medicines:             Monitored Anesthesia Care Complications:         No immediate complications. Estimated blood loss:                         Minimal. Procedure:             Pre-Anesthesia Assessment:                        - Prior to the procedure, a History and Physical was                         performed, and patient medications and allergies were                         reviewed. The patient is competent. The risks and                         benefits of the procedure and the sedation options and                         risks were discussed with the patient. All questions                         were answered and informed consent was obtained.                         Patient identification and proposed procedure were                         verified by the physician, the nurse, the anesthetist                         and the technician in the endoscopy suite. Mental                         Status Examination: alert and oriented. Airway                         Examination: normal oropharyngeal airway and neck                         mobility. Respiratory Examination: clear to                         auscultation. CV Examination: normal. Prophylactic  Antibiotics: The patient does not require prophylactic                         antibiotics. Prior Anticoagulants: The patient has                         taken no previous anticoagulant or antiplatelet                         agents. ASA Grade  Assessment: II - A patient with mild                         systemic disease. After reviewing the risks and                         benefits, the patient was deemed in satisfactory                         condition to undergo the procedure. The anesthesia                         plan was to use monitored anesthesia care (MAC).                         Immediately prior to administration of medications,                         the patient was re-assessed for adequacy to receive                         sedatives. The heart rate, respiratory rate, oxygen                         saturations, blood pressure, adequacy of pulmonary                         ventilation, and response to care were monitored                         throughout the procedure. The physical status of the                         patient was re-assessed after the procedure.                        After obtaining informed consent, the endoscope was                         passed under direct vision. Throughout the procedure,                         the patient's blood pressure, pulse, and oxygen                         saturations were monitored continuously. The Endoscope                         was introduced through the mouth, and advanced to the  second part of duodenum. The upper GI endoscopy was                         accomplished without difficulty. The patient tolerated                         the procedure well. Findings:      LA Grade A (one or more mucosal breaks less than 5 mm, not extending       between tops of 2 mucosal folds) esophagitis with no bleeding was found.      A small hiatal hernia was present.      A few 2 mm sessile polyps with no stigmata of recent bleeding were found       in the gastric fundus. The polyp was removed with a cold biopsy forceps.       Resection and retrieval were complete. Estimated blood loss was minimal.      Patchy mild mucosal changes characterized by  erythema and scarring were       found on the lesser curvature of the stomach. Biopsies were taken with a       cold forceps for Helicobacter pylori testing. Estimated blood loss was       minimal.      Localized moderately erythematous mucosa without active bleeding and       with no stigmata of bleeding was found in the duodenal bulb. Biopsies       were taken with a cold forceps for histology. Estimated blood loss was       minimal.      The exam of the duodenum was otherwise normal. Impression:            - LA Grade A reflux esophagitis with no bleeding.                        - Small hiatal hernia.                        - A few gastric polyps. Resected and retrieved.                        - Erythematous and scarred mucosa in the lesser                         curvature. Biopsied.                        - Erythematous duodenopathy. Biopsied. Recommendation:        - NPO.                        - Continue present medications.                        - Await pathology results.                        - Perform a colonoscopy today. Procedure Code(s):     --- Professional ---                        [phone], Esophagogastroduodenoscopy, flexible,  transoral; with biopsy, single or multiple Diagnosis Code(s):     --- Professional ---                        K21.00, Gastro-esophageal reflux disease with                         esophagitis, without bleeding                        K44.9, Diaphragmatic hernia without obstruction or                         gangrene                        K31.7, Polyp of stomach and duodenum                        K31.89, Other diseases of stomach and duodenum                        D51.0, Vitamin B12 deficiency anemia due to intrinsic                         factor deficiency CPT copyright 2019 American Medical Association. All rights reserved. The codes documented in this report are preliminary and upon coder review may  be revised to  meet current compliance requirements. Andrey Farmer, MD Andrey Farmer MD, MD 02/26/2020 11:10:13 AM Number of Addenda: 0 Note Initiated On: 02/26/2020 10:13 AM Estimated Blood Loss:  Estimated blood loss was minimal.      [facility]

## 2020-02-26 NOTE — Op Note (Signed)
[facility] Gastroenterology Patient Name: Christian Garcia Procedure Date: 02/26/2020 10:11 AM MRN: [id number] Account #: [id number] Date of Birth: 1928-09-02 Admit Type: Outpatient Age: 86 Room: [facility] ENDO ROOM 4 Gender: Male Note Status: Finalized Procedure:             Colonoscopy Indications:           Change in stool caliber Providers:             Andrey Farmer MD, MD Referring MD:          Ocie Cornfield. Ouida Sills MD, MD (Referring MD) Medicines:             Monitored Anesthesia Care Complications:         No immediate complications. Estimated blood loss:                         Minimal. Procedure:             Pre-Anesthesia Assessment:                        - Prior to the procedure, a History and Physical was                         performed, and patient medications and allergies were                         reviewed. The patient is competent. The risks and                         benefits of the procedure and the sedation options and                         risks were discussed with the patient. All questions                         were answered and informed consent was obtained.                         Patient identification and proposed procedure were                         verified by the physician, the nurse, the anesthetist                         and the technician in the procedure room. Mental                         Status Examination: alert and oriented. Airway                         Examination: normal oropharyngeal airway and neck                         mobility. Respiratory Examination: clear to                         auscultation. CV Examination: normal. Prophylactic  Antibiotics: The patient does not require prophylactic                         antibiotics. Prior Anticoagulants: The patient has                         taken no previous anticoagulant or antiplatelet                         agents. ASA Grade Assessment:  II - A patient with mild                         systemic disease. After reviewing the risks and                         benefits, the patient was deemed in satisfactory                         condition to undergo the procedure. The anesthesia                         plan was to use monitored anesthesia care (MAC).                         Immediately prior to administration of medications,                         the patient was re-assessed for adequacy to receive                         sedatives. The heart rate, respiratory rate, oxygen                         saturations, blood pressure, adequacy of pulmonary                         ventilation, and response to care were monitored                         throughout the procedure. The physical status of the                         patient was re-assessed after the procedure.                        After obtaining informed consent, the colonoscope was                         passed under direct vision. Throughout the procedure,                         the patient's blood pressure, pulse, and oxygen                         saturations were monitored continuously. The                         Colonoscope was introduced through the anus and  advanced to the the terminal ileum. The colonoscopy                         was performed without difficulty. The patient                         tolerated the procedure well. The quality of the bowel                         preparation was excellent. Findings:      The perianal and digital rectal examinations were normal.      A 1 to 2 mm polyp was found in the cecum. The polyp was sessile. The       polyp was removed with a jumbo cold forceps. Resection and retrieval       were complete. Estimated blood loss was minimal.      A 2 mm polyp was found in the ascending colon. The polyp was sessile.       The polyp was removed with a jumbo cold forceps. Resection and retrieval        were complete. Estimated blood loss was minimal.      The terminal ileum appeared normal.      The exam was otherwise without abnormality on direct and retroflexion       views. Impression:            - One 1 to 2 mm polyp in the cecum, removed with a                         jumbo cold forceps. Resected and retrieved.                        - One 2 mm polyp in the ascending colon, removed with                         a jumbo cold forceps. Resected and retrieved.                        - The examined portion of the ileum was normal.                        - The examination was otherwise normal on direct and                         retroflexion views. Recommendation:        - Discharge patient to home.                        - Resume previous diet.                        - Continue present medications.                        - Await pathology results.                        - Repeat colonoscopy is not recommended due to current  age (61 years or older) for surveillance.                        - Return to referring physician as previously                         scheduled. Procedure Code(s):     --- Professional ---                        [phone], Colonoscopy, flexible; with biopsy, single or                         multiple Diagnosis Code(s):     --- Professional ---                        K63.5, Polyp of colon                        R19.5, Other fecal abnormalities CPT copyright 2019 American Medical Association. All rights reserved. The codes documented in this report are preliminary and upon coder review may  be revised to meet current compliance requirements. Andrey Farmer, MD Andrey Farmer MD, MD 02/26/2020 11:13:36 AM Number of Addenda: 0 Note Initiated On: 02/26/2020 10:11 AM Scope Withdrawal Time: 0 hours 13 minutes 33 seconds  Total Procedure Duration: 0 hours 21 minutes 23 seconds  Estimated Blood Loss:  Estimated blood loss was minimal.       [facility]

## 2020-02-26 NOTE — H&P (Signed)
Outpatient short stay form Pre-procedure 02/26/2020 10:10 AM Raylene Miyamoto MD, MPH  Primary Physician: Dr. Ouida Sills  Reason for visit:  Hx of sbo  History of present illness:  86 y/o gentleman with history of SBO x 2 and change in bowel habits here for EGD/Colonoscopy for further evaluation. No blood thinners.    Current Facility-Administered Medications:  .  0.9 %  sodium chloride infusion, , Intravenous, Continuous, Ashutosh Dieguez, Hilton Cork, MD, Last Rate: 20 mL/hr at 02/26/20 0917, New Bag at 02/26/20 0917  Medications Prior to Admission  Medication Sig Dispense Refill Last Dose  . Cyanocobalamin 1000 MCG/ML KIT Inject 1,000 mcg into the muscle every 30 (thirty) days.    02/25/2020 at Unknown time  . levothyroxine (SYNTHROID) 75 MCG tablet Take 75 mcg by mouth daily before breakfast.   02/25/2020 at Unknown time  . acidophilus (RISAQUAD) CAPS capsule Take 1 capsule by mouth at bedtime. (Patient not taking: Reported on 02/26/2020)   Completed Course at Unknown time     Allergies  Allergen Reactions  . Sulfa Antibiotics Other (See Comments)    Red skin     Past Medical History:  Diagnosis Date  . Altered bowel habits   . Anemia   . BPH (benign prostatic hypertrophy)   . COPD (chronic obstructive pulmonary disease) ([place])   . Elevated PSA   . History of small bowel obstruction   . Hypothyroidism   . Peyronie's disease   . Prostate cancer [facility])     Review of systems:  Otherwise negative.    Physical Exam  Gen: Alert, oriented. Appears younger than stated age 66: PERRLA. Lungs: no respiratory distress. Abd: soft, benign, no masses. BS+ Ext: No edema. Pulses 2+    Planned procedures: Proceed with EGD/colonoscopy. The patient understands the nature of the planned procedure, indications, risks, alternatives and potential complications including but not limited to bleeding, infection, perforation, damage to internal organs and possible oversedation/side effects from  anesthesia. The patient agrees and gives consent to proceed.  Please refer to procedure notes for findings, recommendations and patient disposition/instructions.     Raylene Miyamoto MD, MPH Gastroenterology 02/26/2020  10:10 AM

## 2020-02-26 NOTE — Transfer of Care (Signed)
Immediate Anesthesia Transfer of Care Note  Patient: Jolaine Click  Procedure(s) Performed: COLONOSCOPY WITH PROPOFOL (N/A ) ESOPHAGOGASTRODUODENOSCOPY (EGD) WITH PROPOFOL (N/A )  Patient Location: PACU and Endoscopy Unit  Anesthesia Type:General  Level of Consciousness: drowsy  Airway & Oxygen Therapy: Patient Spontanous Breathing  Post-op Assessment: Report given to RN and Post -op Vital signs reviewed and stable  Post vital signs: Reviewed and stable  Last Vitals:  Vitals Value Taken Time  BP 92/49 02/26/20 1107  Temp 36.1 C 02/26/20 1107  Pulse 64 02/26/20 1109  Resp 10 02/26/20 1109  SpO2 98 % 02/26/20 1109  Vitals shown include unvalidated device data.  Last Pain:  Vitals:   02/26/20 1107  TempSrc: Tympanic  PainSc: Asleep         Complications: No complications documented.

## 2020-02-27 LAB — SURGICAL PATHOLOGY

## 2020-02-29 ENCOUNTER — Other Ambulatory Visit: Payer: Self-pay

## 2020-02-29 ENCOUNTER — Ambulatory Visit
Admission: RE | Admit: 2020-02-29 | Discharge: 2020-02-29 | Disposition: A | Discharge status: Home / Self Care | Payer: PPO | Source: Ambulatory Visit | Attending: Gastroenterology | Admitting: Gastroenterology

## 2020-02-29 DIAGNOSIS — N400 Enlarged prostate without lower urinary tract symptoms: Secondary | ICD-10-CM | POA: Insufficient documentation

## 2020-02-29 DIAGNOSIS — K56609 Unspecified intestinal obstruction, unspecified as to partial versus complete obstruction: Secondary | ICD-10-CM | POA: Diagnosis not present

## 2020-02-29 DIAGNOSIS — Z8719 Personal history of other diseases of the digestive system: Secondary | ICD-10-CM | POA: Diagnosis not present

## 2020-02-29 DIAGNOSIS — I700 Atherosclerosis of aorta: Secondary | ICD-10-CM | POA: Diagnosis not present

## 2020-02-29 DIAGNOSIS — N320 Bladder-neck obstruction: Secondary | ICD-10-CM | POA: Diagnosis not present

## 2020-02-29 MED ORDER — IOHEXOL 300 MG/ML  SOLN
100.0000 mL | Freq: Once | INTRAMUSCULAR | Status: AC | PRN
  Administered 2020-02-29: 100 mL via INTRAVENOUS

## 2020-03-07 DIAGNOSIS — D510 Vitamin B12 deficiency anemia due to intrinsic factor deficiency: Secondary | ICD-10-CM | POA: Diagnosis not present

## 2020-03-25 DIAGNOSIS — D485 Neoplasm of uncertain behavior of skin: Secondary | ICD-10-CM | POA: Diagnosis not present

## 2020-03-25 DIAGNOSIS — C44222 Squamous cell carcinoma of skin of right ear and external auricular canal: Secondary | ICD-10-CM | POA: Diagnosis not present

## 2020-04-09 DIAGNOSIS — Z23 Encounter for immunization: Secondary | ICD-10-CM | POA: Diagnosis not present

## 2020-04-09 DIAGNOSIS — D510 Vitamin B12 deficiency anemia due to intrinsic factor deficiency: Secondary | ICD-10-CM | POA: Diagnosis not present

## 2020-04-28 NOTE — Progress Notes (Signed)
9:35 AM   Christian Garcia [birth year] [id number]  Referring provider: Kirk Ruths, MD [facility] [place],  [place] [address]  Chief Complaint  Patient presents with  . Follow-up    6mth follow-up for PSA, IPSS    HPI: Patient is an 86 year old male with prostate cancer who presents today for a 6 month follow up.    Patient underwent cryoablation in September 2009 for a Gleason's 7 adenocarcinoma of the prostate.     BPH WITH LUTS  (prostate and/or bladder) IPSS score: 1/0   Major complaint(s):  None.  Denies any dysuria, hematuria or suprapubic pain.   Prostate volume 59 cc   Denies any recent fevers, chills, nausea or vomiting.     IPSS    Row Name 04/29/20 0900         International Prostate Symptom Score   How often have you had the sensation of not emptying your bladder? Not at All     How often have you had to urinate less than every two hours? Not at All     How often have you found you stopped and started again several times when you urinated? Not at All     How often have you found it difficult to postpone urination? Not at All     How often have you had a weak urinary stream? Not at All     How often have you had to strain to start urination? Not at All     How many times did you typically get up at night to urinate? 1 Time     Total IPSS Score 1       Quality of Life due to urinary symptoms   If you were to spend the rest of your life with your urinary condition just the way it is now how would you feel about that? Delighted            Score:  1-7 Mild 8-19 Moderate 20-35 Severe  PMH: Past Medical History:  Diagnosis Date  . Altered bowel habits   . Anemia   . BPH (benign prostatic hypertrophy)   . COPD (chronic obstructive pulmonary disease) ([place])   . Elevated PSA   . History of small bowel obstruction   . Hypothyroidism   . Peyronie's disease   . Prostate cancer [facility])     Surgical  History: Past Surgical History:  Procedure Laterality Date  . APPENDECTOMY    . CATARACT EXTRACTION W/PHACO Left 03/24/2017   Procedure: CATARACT EXTRACTION PHACO AND INTRAOCULAR LENS PLACEMENT ([place]) LEFT;  Surgeon: Leandrew Koyanagi, MD;  Location: [place];  Service: Ophthalmology;  Laterality: Left;  . CATARACT EXTRACTION W/PHACO Right 05/05/2017   Procedure: CATARACT EXTRACTION PHACO AND INTRAOCULAR LENS PLACEMENT ([place]) RIGHT;  Surgeon: Leandrew Koyanagi, MD;  Location: [place];  Service: Ophthalmology;  Laterality: Right;  . COLONOSCOPY WITH PROPOFOL N/A 02/26/2020   Procedure: COLONOSCOPY WITH PROPOFOL;  Surgeon: Lesly Rubenstein, MD;  Location: [facility] ENDOSCOPY;  Service: Endoscopy;  Laterality: N/A;  . CRYOABLATION  2009   prostate  . ESOPHAGOGASTRODUODENOSCOPY (EGD) WITH PROPOFOL N/A 02/26/2020   Procedure: ESOPHAGOGASTRODUODENOSCOPY (EGD) WITH PROPOFOL;  Surgeon: Lesly Rubenstein, MD;  Location: [facility] ENDOSCOPY;  Service: Endoscopy;  Laterality: N/A;  . FOOT SURGERY  right   ganglion cyst removed  . PROSTATE SURGERY      Home Medications:  Allergies as of 04/29/2020  Reactions   Sulfa Antibiotics Other (See Comments)   Red skin      Medication List       Accurate as of April 29, 2020  9:35 AM. If you have any questions, ask your nurse or doctor.        STOP taking these medications   acidophilus Caps capsule Stopped by: Zara Council, PA-C     TAKE these medications   Cyanocobalamin 1000 MCG/ML Kit Inject 1,000 mcg into the muscle every 30 (thirty) days.   levothyroxine 75 MCG tablet Commonly known as: SYNTHROID Take 75 mcg by mouth daily before breakfast.       Allergies:  Allergies  Allergen Reactions  . Sulfa Antibiotics Other (See Comments)    Red skin    Family History: Family History  Problem Relation Age of Onset  . Prostate cancer Brother        x 3  . Breast cancer Sister   . Kidney disease Neg Hx    . Kidney cancer Neg Hx   . Bladder Cancer Neg Hx     Social History:  reports that he has never smoked. He has never used smokeless tobacco. He reports that he does not drink alcohol and does not use drugs.  ROS: For pertinent review of systems please refer to history of present illness  Physical Exam: BP (!) 147/89   Pulse 67   Ht $R'5\' 10"'ZB$  (1.778 m)   Wt 176 lb (79.8 kg)   BMI 25.25 kg/m   Constitutional:  Well nourished. Alert and oriented, No acute distress. HEENT: [place] AT, mask in place.  Trachea midline Cardiovascular: No clubbing, cyanosis, or edema. Respiratory: Normal respiratory effort, no increased work of breathing. GU: No CVA tenderness.  No bladder fullness or masses.  Patient with uncircumcised phallus.  Foreskin easily retracted  Urethral meatus is patent.  No penile discharge. No penile lesions or rashes. Scrotum without lesions, cysts, rashes and/or edema.  Testicles are located scrotally bilaterally. No masses are appreciated in the testicles. Left and right epididymis are normal. Rectal: Patient with  normal sphincter tone. Anus and perineum without scarring or rashes. No rectal masses are appreciated. Prostate is approximately 50 grams, could only palpate the apex, no nodules are appreciated. Seminal vesicles could not be palpated Skin: No rashes, bruises or suspicious lesions. Lymph: No inguinal adenopathy. Neurologic: Grossly intact, no focal deficits, moving all 4 extremities. Psychiatric: Normal mood and affect.   Laboratory Data: PSA history  0.6 ng/mL on 05/16/2013  0.5 ng/mL on 11/15/2013  0.6 ng/mL on 05/31/2014  0.5 ng/mL on 11/30/2014  0.6 ng/mL on 05/24/2015  0.6 ng/mL on 11/22/2015  0.7 ng/mL on 05/25/2016  0.64 ng/mL on 12/03/2016  Component     Latest Ref Rng & Units 06/11/2017 01/07/2018 08/08/2018 02/06/2019  Prostatic Specific Antigen     0.00 - 4.00 ng/mL 0.76 0.50 0.61 0.60   Component     Latest Ref Rng & Units 08/14/2019  Prostatic Specific  Antigen     0.00 - 4.00 ng/mL 0.58   Urinalysis Component     Latest Ref Rng & Units 01/25/2020  Color, Urine     YELLOW YELLOW (A)  Appearance     CLEAR HAZY (A)  Specific Gravity, Urine     1.005 - 1.030 1.023  pH     5.0 - 8.0 5.0  Glucose, UA     NEGATIVE mg/dL NEGATIVE  Hgb urine dipstick     NEGATIVE NEGATIVE  Bilirubin  Urine     NEGATIVE NEGATIVE  Ketones, ur     NEGATIVE mg/dL 5 (A)  Protein     NEGATIVE mg/dL NEGATIVE  Nitrite     NEGATIVE NEGATIVE  Leukocytes,Ua     NEGATIVE NEGATIVE  RBC / HPF     0 - 5 RBC/hpf 0-5  WBC, UA     0 - 5 WBC/hpf 0-5  Bacteria, UA     NONE SEEN NONE SEEN  Squamous Epithelial / LPF     0 - 5 0-5  Mucus      PRESENT   I have reviewed the labs.   Pertinent imaging CLINICAL DATA:  Abdominal pain and vomiting.  EXAM: CT ABDOMEN AND PELVIS WITH CONTRAST  TECHNIQUE: Multidetector CT imaging of the abdomen and pelvis was performed using the standard protocol following bolus administration of intravenous contrast.  CONTRAST:  159mL OMNIPAQUE IOHEXOL 300 MG/ML  SOLN  COMPARISON:  None.  FINDINGS: LOWER CHEST: Normal.  HEPATOBILIARY: Normal hepatic contours. No intra- or extrahepatic biliary dilatation. The gallbladder is normal.  PANCREAS: Normal pancreas. No ductal dilatation or peripancreatic fluid collection.  SPLEEN: Multiple calcified granulomata.  ADRENALS/URINARY TRACT: The adrenal glands are normal. No hydronephrosis, nephroureterolithiasis or solid renal mass. The urinary bladder is normal for degree of distention  STOMACH/BOWEL: There is no hiatal hernia. Normal duodenal course and caliber. The proximal small bowel is fluid-filled and measures at the upper limits of normal for caliber. There is a transition to a smaller caliber in the right mid abdomen (axial image 43 and coronal image 24. Colon is decompressed. The appendix is not visualized. No right lower quadrant inflammation or free  fluid.  VASCULAR/LYMPHATIC: There is calcific atherosclerosis of the abdominal aorta. No abdominal or pelvic lymphadenopathy.  REPRODUCTIVE: Prostate is enlarged and heterogeneous measuring 4.0 x 5.9 x 4.6 cm  MUSCULOSKELETAL. No bony spinal canal stenosis or focal osseous abnormality.  OTHER: None.  IMPRESSION: 1. Proximal small bowel upper limits of normal in size with transition to smaller caliber in the right mid abdomen. This may indicate early obstruction. The appearance is similar to 08/29/19. 2. Enlarged, heterogeneous prostate gland. 3. Aortic Atherosclerosis (ICD10-I70.0).   Electronically Signed   By: Ulyses Jarred M.D.   On: 01/26/2020 01:18 I have independently reviewed the films.  See HPI.   Assessment & Plan:    1. Prostate cancer [facility]):   Patient underwent cryoablation in September 2009 for a Gleason's 7 adenocarcinoma of the prostate Continue monitoring every 6 months. He will return for a PSA, IPSS and exam at that time - if PSA remains stable   2. BPH with LUTS IPSS score is 1/0  Continue conservative management, avoiding bladder irritants and timed voiding's RTC in 6 months for IPSS, PSA, PVR and exam    Return in about 6 months (around 10/27/2020) for  I PSS, PSA and exam .  Zara Council, [facility]  [address] [place], [place] [address] [phone]

## 2020-04-29 ENCOUNTER — Ambulatory Visit (INDEPENDENT_AMBULATORY_CARE_PROVIDER_SITE_OTHER): Payer: PPO | Admitting: Urology

## 2020-04-29 ENCOUNTER — Other Ambulatory Visit: Payer: Self-pay

## 2020-04-29 ENCOUNTER — Other Ambulatory Visit
Admission: RE | Admit: 2020-04-29 | Discharge: 2020-04-29 | Disposition: A | Discharge status: Home / Self Care | Payer: PPO | Attending: Urology | Admitting: Urology

## 2020-04-29 ENCOUNTER — Telehealth: Payer: Self-pay | Admitting: Family Medicine

## 2020-04-29 ENCOUNTER — Encounter: Payer: Self-pay | Admitting: Urology

## 2020-04-29 ENCOUNTER — Other Ambulatory Visit: Payer: Self-pay | Admitting: *Deleted

## 2020-04-29 VITALS — BP 147/89 | HR 67 | Ht 70.0 in | Wt 176.0 lb

## 2020-04-29 DIAGNOSIS — C61 Malignant neoplasm of prostate: Secondary | ICD-10-CM

## 2020-04-29 DIAGNOSIS — N401 Enlarged prostate with lower urinary tract symptoms: Secondary | ICD-10-CM

## 2020-04-29 LAB — PSA: Prostatic Specific Antigen: 0.59 ng/mL (ref 0.00–4.00)

## 2020-04-29 NOTE — Telephone Encounter (Signed)
Patient notified and voiced understanding.

## 2020-04-29 NOTE — Telephone Encounter (Signed)
-----   Message from Nori Riis, PA-C sent at 04/29/2020  2:01 PM EDT ----- Please let Mr. Avena know that his PSA remains steady at 0.59.

## 2020-05-01 DIAGNOSIS — C44222 Squamous cell carcinoma of skin of right ear and external auricular canal: Secondary | ICD-10-CM | POA: Diagnosis not present

## 2020-05-03 DIAGNOSIS — D510 Vitamin B12 deficiency anemia due to intrinsic factor deficiency: Secondary | ICD-10-CM | POA: Diagnosis not present

## 2020-05-03 DIAGNOSIS — N1831 Chronic kidney disease, stage 3a: Secondary | ICD-10-CM | POA: Diagnosis not present

## 2020-05-03 DIAGNOSIS — E039 Hypothyroidism, unspecified: Secondary | ICD-10-CM | POA: Diagnosis not present

## 2020-05-03 DIAGNOSIS — E7800 Pure hypercholesterolemia, unspecified: Secondary | ICD-10-CM | POA: Diagnosis not present

## 2020-05-10 DIAGNOSIS — E039 Hypothyroidism, unspecified: Secondary | ICD-10-CM | POA: Diagnosis not present

## 2020-05-10 DIAGNOSIS — Z0000 Encounter for general adult medical examination without abnormal findings: Secondary | ICD-10-CM | POA: Diagnosis not present

## 2020-05-10 DIAGNOSIS — E7800 Pure hypercholesterolemia, unspecified: Secondary | ICD-10-CM | POA: Diagnosis not present

## 2020-05-10 DIAGNOSIS — N1831 Chronic kidney disease, stage 3a: Secondary | ICD-10-CM | POA: Diagnosis not present

## 2020-05-10 DIAGNOSIS — D510 Vitamin B12 deficiency anemia due to intrinsic factor deficiency: Secondary | ICD-10-CM | POA: Diagnosis not present

## 2020-06-10 DIAGNOSIS — D510 Vitamin B12 deficiency anemia due to intrinsic factor deficiency: Secondary | ICD-10-CM | POA: Diagnosis not present

## 2020-06-19 DIAGNOSIS — J069 Acute upper respiratory infection, unspecified: Secondary | ICD-10-CM | POA: Diagnosis not present

## 2020-07-10 DIAGNOSIS — D2261 Melanocytic nevi of right upper limb, including shoulder: Secondary | ICD-10-CM | POA: Diagnosis not present

## 2020-07-10 DIAGNOSIS — X32XXXA Exposure to sunlight, initial encounter: Secondary | ICD-10-CM | POA: Diagnosis not present

## 2020-07-10 DIAGNOSIS — L820 Inflamed seborrheic keratosis: Secondary | ICD-10-CM | POA: Diagnosis not present

## 2020-07-10 DIAGNOSIS — B079 Viral wart, unspecified: Secondary | ICD-10-CM | POA: Diagnosis not present

## 2020-07-10 DIAGNOSIS — L538 Other specified erythematous conditions: Secondary | ICD-10-CM | POA: Diagnosis not present

## 2020-07-10 DIAGNOSIS — D2262 Melanocytic nevi of left upper limb, including shoulder: Secondary | ICD-10-CM | POA: Diagnosis not present

## 2020-07-10 DIAGNOSIS — L570 Actinic keratosis: Secondary | ICD-10-CM | POA: Diagnosis not present

## 2020-07-10 DIAGNOSIS — D485 Neoplasm of uncertain behavior of skin: Secondary | ICD-10-CM | POA: Diagnosis not present

## 2020-07-10 DIAGNOSIS — L821 Other seborrheic keratosis: Secondary | ICD-10-CM | POA: Diagnosis not present

## 2020-07-10 DIAGNOSIS — D2271 Melanocytic nevi of right lower limb, including hip: Secondary | ICD-10-CM | POA: Diagnosis not present

## 2020-07-10 DIAGNOSIS — Z85828 Personal history of other malignant neoplasm of skin: Secondary | ICD-10-CM | POA: Diagnosis not present

## 2020-07-11 DIAGNOSIS — D510 Vitamin B12 deficiency anemia due to intrinsic factor deficiency: Secondary | ICD-10-CM | POA: Diagnosis not present

## 2020-10-25 ENCOUNTER — Other Ambulatory Visit: Payer: Self-pay | Admitting: *Deleted

## 2020-10-25 DIAGNOSIS — C61 Malignant neoplasm of prostate: Secondary | ICD-10-CM

## 2020-10-25 NOTE — Progress Notes (Signed)
9:52 AM   [name] 09/03/1928 [id number]  Referring provider: Kirk Ruths, MD [facility] [place],  [place] [address]  Chief Complaint  Patient presents with  . Follow-up    94mth follow-up with PSA   Urological history: 1. Prostate cancer -PSA 0.59 in 04/2020 -cryoablation in September 2009 for a Gleason's 7 adenocarcinoma of the prostate  2. LU TS -I PSS 1/0 -prostate volume 59 cc  HPI: Christian Garcia is a 87 y.o. male who presents today for yearly follow up.   No urinary complaints at this visit.    Patient denies any modifying or aggravating factors.  Patient denies any gross hematuria, dysuria or suprapubic/flank pain.  Patient denies any fevers, chills, nausea or vomiting.     IPSS    Row Name 10/28/20 0900         International Prostate Symptom Score   How often have you had the sensation of not emptying your bladder? Not at All     How often have you had to urinate less than every two hours? Not at All     How often have you found you stopped and started again several times when you urinated? Not at All     How often have you found it difficult to postpone urination? Not at All     How often have you had a weak urinary stream? Not at All     How often have you had to strain to start urination? Not at All     How many times did you typically get up at night to urinate? 1 Time     Total IPSS Score 1           Quality of Life due to urinary symptoms   If you were to spend the rest of your life with your urinary condition just the way it is now how would you feel about that? Delighted            Score:  1-7 Mild 8-19 Moderate 20-35 Severe  PMH: Past Medical History:  Diagnosis Date  . Altered bowel habits   . Anemia   . BPH (benign prostatic hypertrophy)   . COPD (chronic obstructive pulmonary disease) ([place])   . Elevated PSA   . History of small bowel obstruction   . Hypothyroidism   . Peyronie's  disease   . Prostate cancer [facility])     Surgical History: Past Surgical History:  Procedure Laterality Date  . APPENDECTOMY    . CATARACT EXTRACTION W/PHACO Left 03/24/2017   Procedure: CATARACT EXTRACTION PHACO AND INTRAOCULAR LENS PLACEMENT ([place]) LEFT;  Surgeon: Leandrew Koyanagi, MD;  Location: [place];  Service: Ophthalmology;  Laterality: Left;  . CATARACT EXTRACTION W/PHACO Right 05/05/2017   Procedure: CATARACT EXTRACTION PHACO AND INTRAOCULAR LENS PLACEMENT ([place]) RIGHT;  Surgeon: Leandrew Koyanagi, MD;  Location: [place];  Service: Ophthalmology;  Laterality: Right;  . COLONOSCOPY WITH PROPOFOL N/A 02/26/2020   Procedure: COLONOSCOPY WITH PROPOFOL;  Surgeon: Lesly Rubenstein, MD;  Location: [facility] ENDOSCOPY;  Service: Endoscopy;  Laterality: N/A;  . CRYOABLATION  2009   prostate  . ESOPHAGOGASTRODUODENOSCOPY (EGD) WITH PROPOFOL N/A 02/26/2020   Procedure: ESOPHAGOGASTRODUODENOSCOPY (EGD) WITH PROPOFOL;  Surgeon: Lesly Rubenstein, MD;  Location: [facility] ENDOSCOPY;  Service: Endoscopy;  Laterality: N/A;  . FOOT SURGERY  right   ganglion cyst removed  . PROSTATE SURGERY      Home Medications:  Allergies as of 10/28/2020      Reactions   Sulfa Antibiotics Other (See Comments)   Red skin      Medication List       Accurate as of October 28, 2020  9:52 AM. If you have any questions, ask your nurse or doctor.        Cyanocobalamin 1000 MCG/ML Kit Inject 1,000 mcg into the muscle every 30 (thirty) days.   levothyroxine 75 MCG tablet Commonly known as: SYNTHROID Take 75 mcg by mouth daily before breakfast.       Allergies:  Allergies  Allergen Reactions  . Sulfa Antibiotics Other (See Comments)    Red skin    Family History: Family History  Problem Relation Age of Onset  . Prostate cancer Brother        x 3  . Breast cancer Sister   . Kidney disease Neg Hx   . Kidney cancer Neg Hx   . Bladder Cancer Neg Hx     Social History:   reports that he has never smoked. He has never used smokeless tobacco. He reports that he does not drink alcohol and does not use drugs.  ROS: For pertinent review of systems please refer to history of present illness  Physical Exam: BP (!) 156/77   Pulse (!) 58   Ht $R'5\' 10"'ip$  (1.778 m)   Wt 185 lb (83.9 kg)   BMI 26.54 kg/m   Constitutional:  Well nourished. Alert and oriented, No acute distress. HEENT: [place] AT, mask in place  Trachea midline Cardiovascular: No clubbing, cyanosis, or edema. Respiratory: Normal respiratory effort, no increased work of breathing.  Neurologic: Grossly intact, no focal deficits, moving all 4 extremities. Psychiatric: Normal mood and affect.   Laboratory Data: Specimen:  Blood  Ref Range & Units 5 mo ago  Glucose 70 - 110 mg/dL 101   Sodium 136 - 145 mmol/L 141   Potassium 3.6 - 5.1 mmol/L 4.8   Chloride 97 - 109 mmol/L 106   Carbon Dioxide (CO2) 22.0 - 32.0 mmol/L 29.2   Urea Nitrogen (BUN) 7 - 25 mg/dL 18   Creatinine 0.7 - 1.3 mg/dL 1.1   Glomerular Filtration Rate (eGFR), MDRD Estimate >60 mL/min/1.73sq m 63   Calcium 8.7 - 10.3 mg/dL 9.3   AST  8 - 39 U/L 17   ALT  6 - 57 U/L 11   Alk Phos (alkaline Phosphatase) 34 - 104 U/L 40   Albumin 3.5 - 4.8 g/dL 4.1   Bilirubin, Total 0.3 - 1.2 mg/dL 0.6   Protein, Total 6.1 - 7.9 g/dL 6.5   A/G Ratio 1.0 - 5.0 gm/dL 1.7   Resulting Agency  [place] - LAB  Specimen Collected: 05/03/20 7:08 AM Last Resulted: 05/03/20 1:15 PM  Received From: [place]  Result Received: 10/25/20 8:52 AM   Specimen:  Blood  Ref Range & Units 5 mo ago  Cholesterol, Total 100 - 200 mg/dL 185   Triglyceride 35 - 199 mg/dL 102   HDL (High Density Lipoprotein) Cholesterol 29.0 - 71.0 mg/dL 40.9   LDL Calculated 0 - 130 mg/dL 124   VLDL Cholesterol mg/dL 20   Cholesterol/HDL Ratio  4.5   Resulting Agency  [place] - LAB  Specimen Collected: 05/03/20 7:08 AM Last Resulted:  05/03/20 1:15 PM  Received From: [place]  Result Received: 10/25/20 8:52 AM   Specimen:  Blood  Ref Range & Units 5 mo ago  Thyroid Stimulating Hormone (TSH) 0.450-5.330 uIU/ml uIU/mL 2.518   Resulting Agency  [facility] - LAB  Specimen Collected: 05/03/20 7:08 AM Last Resulted: 05/03/20 10:30 AM  Received From: [place]  Result Received: 10/25/20 8:52 AM  I have reviewed the labs.   Pertinent imaging No recent imaging  Assessment & Plan:    1. Prostate cancer [facility]):    -PSA pending   2. BPH with LUTS -IPSS score is stable -Continue conservative management, avoiding bladder irritants and timed voiding's  Return in about 6 months (around 04/30/2021) for PSA and I PSS .  Zara Council, PA-C  [facility] [address] [place], [place] [address] [phone]

## 2020-10-28 ENCOUNTER — Encounter: Payer: Self-pay | Admitting: Urology

## 2020-10-28 ENCOUNTER — Ambulatory Visit: Payer: Medicare HMO | Admitting: Urology

## 2020-10-28 ENCOUNTER — Other Ambulatory Visit
Admission: RE | Admit: 2020-10-28 | Discharge: 2020-10-28 | Disposition: A | Discharge status: Home / Self Care | Payer: Medicare HMO | Attending: Urology | Admitting: Urology

## 2020-10-28 ENCOUNTER — Other Ambulatory Visit: Payer: Self-pay

## 2020-10-28 VITALS — BP 156/77 | HR 58 | Ht 70.0 in | Wt 185.0 lb

## 2020-10-28 DIAGNOSIS — C61 Malignant neoplasm of prostate: Secondary | ICD-10-CM

## 2020-10-28 DIAGNOSIS — R399 Unspecified symptoms and signs involving the genitourinary system: Secondary | ICD-10-CM

## 2020-10-28 LAB — PSA: Prostatic Specific Antigen: 0.55 ng/mL (ref 0.00–4.00)

## 2021-05-04 NOTE — Progress Notes (Signed)
10:06 AM   Christian Garcia [birth year] [id number]  Referring provider: Kirk Ruths, MD [facility] [place],  [place] [id number]  Chief Complaint  Patient presents with   Follow-up    87mh follow-up    Urological history: 1. Prostate cancer -PSA pending -cryoablation in September 2009 for a Gleason's 7 adenocarcinoma of the prostate  2. BPH with LU TS -I PSS 1/0 -prostate volume 59 cc  HPI: Christian BORINis a 87y.o. male who presents today for yearly follow up.   He has no urinary complaints at this visit.  Patient denies any modifying or aggravating factors.  Patient denies any gross hematuria, dysuria or suprapubic/flank pain.  Patient denies any fevers, chills, nausea or vomiting.     IPSS     Row Name 05/05/21 0900         International Prostate Symptom Score   How often have you had the sensation of not emptying your bladder? Not at All     How often have you had to urinate less than every two hours? Not at All     How often have you found you stopped and started again several times when you urinated? Not at All     How often have you found it difficult to postpone urination? Not at All     How often have you had a weak urinary stream? Not at All     How often have you had to strain to start urination? Not at All     How many times did you typically get up at night to urinate? 1 Time     Total IPSS Score 1           Quality of Life due to urinary symptoms   If you were to spend the rest of your life with your urinary condition just the way it is now how would you feel about that? Delighted               Score:  1-7 Mild 8-19 Moderate 20-35 Severe  PMH: Past Medical History:  Diagnosis Date   Altered bowel habits    Anemia    BPH (benign prostatic hypertrophy)    COPD (chronic obstructive pulmonary disease) ([facility])    Elevated PSA    History of small bowel obstruction    Hypothyroidism    Peyronie's  disease    Prostate cancer ([facility]     Surgical History: Past Surgical History:  Procedure Laterality Date   APPENDECTOMY     CATARACT EXTRACTION W/PHACO Left 03/24/2017   Procedure: CATARACT EXTRACTION PHACO AND INTRAOCULAR LENS PLACEMENT ([place] LEFT;  Surgeon: BLeandrew Koyanagi MD;  [place]  Service: Ophthalmology;  Laterality: Left;   CATARACT EXTRACTION W/PHACO Right 05/05/2017   Procedure: CATARACT EXTRACTION PHACO AND INTRAOCULAR LENS PLACEMENT ([place];  Surgeon: BLeandrew Koyanagi MD;  [place]  Service: Ophthalmology;  Laterality: Right;   COLONOSCOPY WITH PROPOFOL N/A 02/26/2020   Procedure: COLONOSCOPY WITH PROPOFOL;  Surgeon: LLesly Rubenstein MD;  Location: [facility] ENDOSCOPY;  Service: Endoscopy;  Laterality: N/A;   CRYOABLATION  2009   prostate   ESOPHAGOGASTRODUODENOSCOPY (EGD) WITH PROPOFOL N/A 02/26/2020   Procedure: ESOPHAGOGASTRODUODENOSCOPY (EGD) WITH PROPOFOL;  Surgeon: LLesly Rubenstein MD;  Location: [facility] ENDOSCOPY;  Service: Endoscopy;  Laterality: N/A;   FOOT SURGERY  right   ganglion cyst removed   PROSTATE SURGERY  Home Medications:  Allergies as of 05/05/2021       Reactions   Sulfa Antibiotics Other (See Comments)   Red skin        Medication List        Accurate as of May 05, 2021 10:06 AM. If you have any questions, ask your nurse or doctor.          Cyanocobalamin 1000 MCG/ML Kit Inject 1,000 mcg into the muscle every 30 (thirty) days.   levothyroxine 75 MCG tablet Commonly known as: SYNTHROID Take 75 mcg by mouth daily before breakfast.        Allergies:  Allergies  Allergen Reactions   Sulfa Antibiotics Other (See Comments)    Red skin    Family History: Family History  Problem Relation Age of Onset   Prostate cancer Brother        x 3   Breast cancer Sister    Kidney disease Neg Hx    Kidney cancer Neg Hx    Bladder Cancer Neg Hx     Social History:   reports that he has never smoked. He has never used smokeless tobacco. He reports that he does not drink alcohol and does not use drugs.  ROS: For pertinent review of systems please refer to history of present illness  Physical Exam: BP (!) 152/85   Pulse 68   Ht _0  (1.778 m)   Wt 184 lb (83.5 kg)   BMI 26.40 kg/m   Constitutional:  Well nourished. Alert and oriented, No acute distress. HEENT: [place] AT, mask in place.  Trachea midline Cardiovascular: No clubbing, cyanosis, or edema. Respiratory: Normal respiratory effort, no increased work of breathing. Neurologic: Grossly intact, no focal deficits, moving all 4 extremities. Psychiatric: Normal mood and affect.    Laboratory Data: Glucose 70 - 110 mg/dL 101   Sodium 136 - 145 mmol/L 141   Potassium 3.6 - 5.1 mmol/L 4.7   Chloride 97 - 109 mmol/L 107   Carbon Dioxide (CO2) 22.0 - 32.0 mmol/L 28.6   Urea Nitrogen (BUN) 7 - 25 mg/dL 23   Creatinine 0.7 - 1.3 mg/dL 1.1   Glomerular Filtration Rate (eGFR), MDRD Estimate >60 mL/min/1.73sq m 63   Calcium 8.7 - 10.3 mg/dL 9.1   AST  8 - 39 U/L 14   ALT  6 - 57 U/L 11   Alk Phos (alkaline Phosphatase) 34 - 104 U/L 43   Albumin 3.5 - 4.8 g/dL 4.1   Bilirubin, Total 0.3 - 1.2 mg/dL 0.5   Protein, Total 6.1 - 7.9 g/dL 6.3   A/G Ratio 1.0 - 5.0 gm/dL 1.9   Resulting Agency  [place] - LAB  Specimen Collected: 11/04/20 07:19 Last Resulted: 11/04/20 10:33  Received From: [place]  Result Received: 05/04/21 17:20   Cholesterol, Total 100 - 200 mg/dL 182   Triglyceride 35 - 199 mg/dL 81   HDL (High Density Lipoprotein) Cholesterol 29.0 - 71.0 mg/dL 42.1   LDL Calculated 0 - 130 mg/dL 124   VLDL Cholesterol mg/dL 16   Cholesterol/HDL Ratio  4.3   Resulting Agency  [place] - LAB  Specimen Collected: 11/04/20 07:19 Last Resulted: 11/04/20 10:33  Received From: [place]  Result Received: 05/04/21 17:20   Thyroid  Stimulating Hormone (TSH) 0.450-5.330 uIU/ml uIU/mL 3.334   Resulting Agency  [place] - LAB  Specimen Collected: 11/04/20 07:19 Last Resulted: 11/04/20 09:53  Received From: [place]  [facility]  Result Received: 05/04/21 17:20  I have reviewed the labs.     Pertinent imaging No recent imaging  Assessment & Plan:    1. Prostate cancer [facility]):    -PSA pending   2. BPH with LUTS -PSA pending -continue conservative management, avoiding bladder irritants and timed voiding's   Return in about 1 year (around 05/05/2022) for PSA, I PSS.  Zara Council, PA-C  [facility] [address] [place], [place] [address] [phone]

## 2021-05-05 ENCOUNTER — Other Ambulatory Visit: Payer: Self-pay

## 2021-05-05 ENCOUNTER — Other Ambulatory Visit: Payer: Self-pay | Admitting: *Deleted

## 2021-05-05 ENCOUNTER — Other Ambulatory Visit
Admission: RE | Admit: 2021-05-05 | Discharge: 2021-05-05 | Disposition: A | Discharge status: Home / Self Care | Payer: Medicare HMO | Attending: Urology | Admitting: Urology

## 2021-05-05 ENCOUNTER — Ambulatory Visit: Payer: Medicare HMO | Admitting: Urology

## 2021-05-05 ENCOUNTER — Encounter: Payer: Self-pay | Admitting: Urology

## 2021-05-05 VITALS — BP 152/85 | HR 68 | Ht 70.0 in | Wt 184.0 lb

## 2021-05-05 DIAGNOSIS — C61 Malignant neoplasm of prostate: Secondary | ICD-10-CM | POA: Insufficient documentation

## 2021-05-05 DIAGNOSIS — N138 Other obstructive and reflux uropathy: Secondary | ICD-10-CM

## 2021-05-05 DIAGNOSIS — N401 Enlarged prostate with lower urinary tract symptoms: Secondary | ICD-10-CM

## 2021-05-05 LAB — PSA: Prostatic Specific Antigen: 0.61 ng/mL (ref 0.00–4.00)

## 2021-05-05 NOTE — Addendum Note (Signed)
Addended by: Memory Argue H on: 05/05/2021 09:08 AM   Modules accepted: Orders

## 2022-02-17 IMAGING — DX DG ABDOMEN 1V
1 series · 1 of 1 positions shown · non-contrast
Comparison: None.

CLINICAL DATA: NG tube placement.

EXAM:
ABDOMEN - 1 VIEW

[abdomen supine]
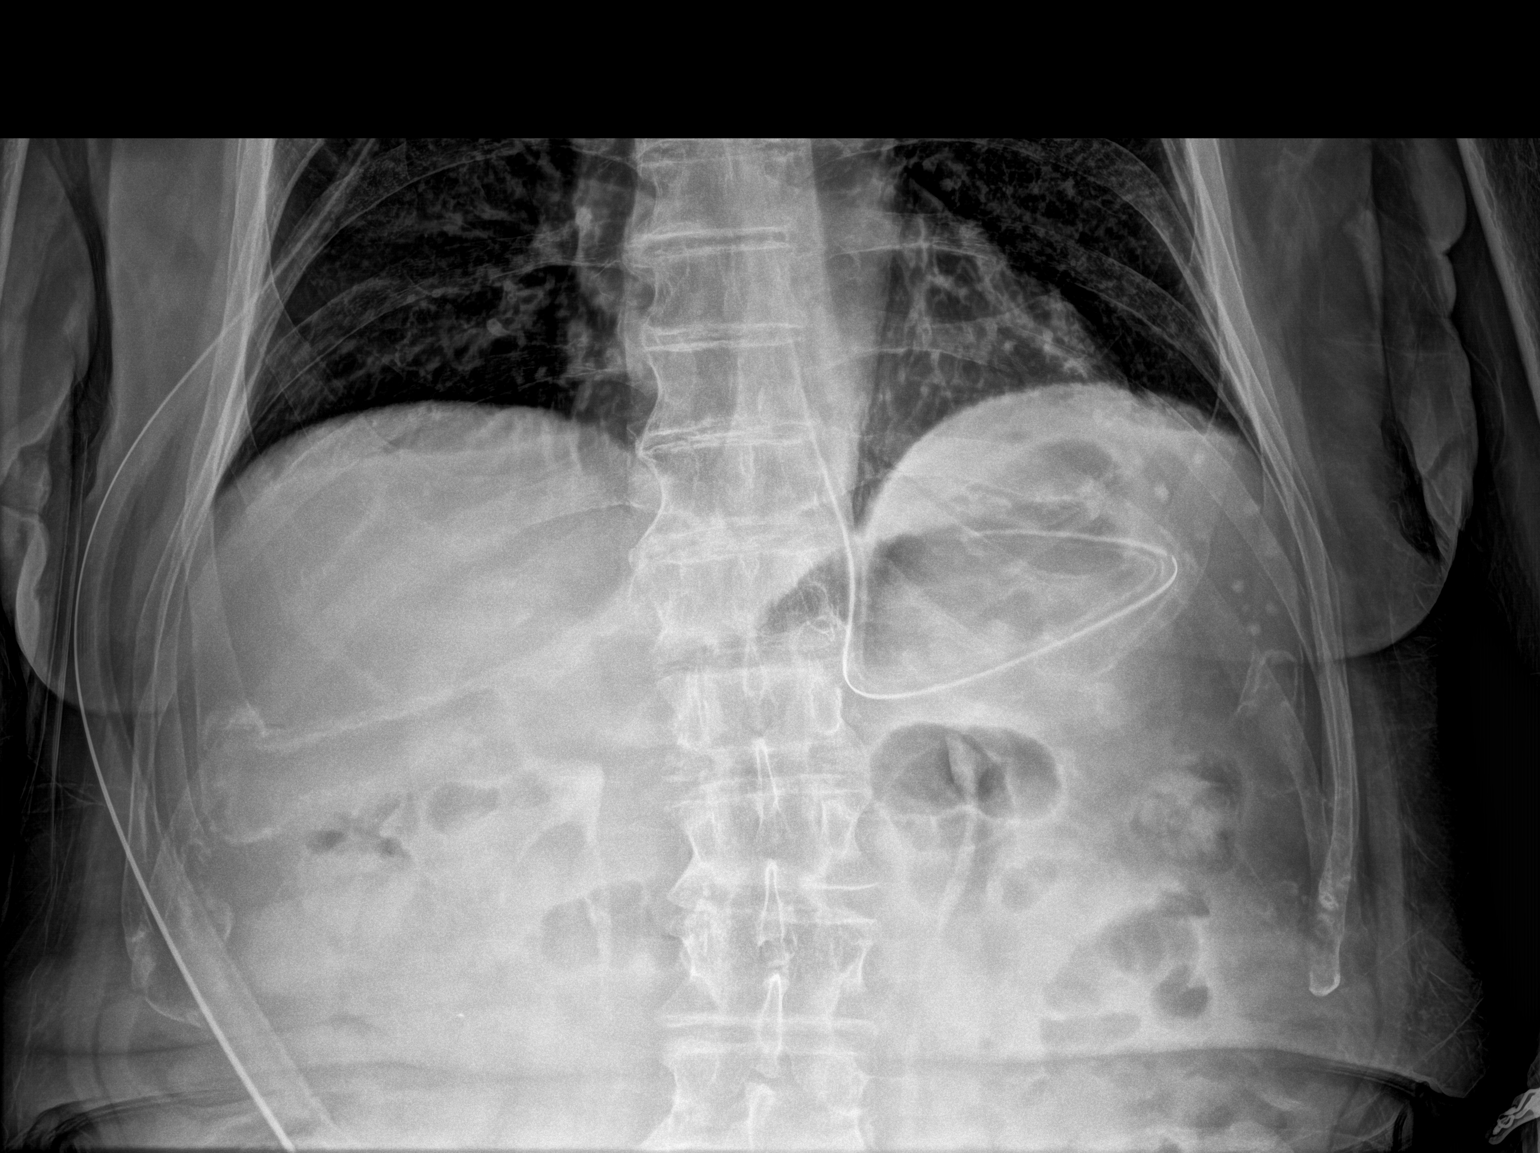

[1 of 1 positions shown; findings below may reference images not displayed]

FINDINGS: Side port of the NG tube is in the stomach. Lung bases are clear.
Bowel gas pattern is normal.
IMPRESSION: Side port of the NG tube in the stomach.

## 2022-02-17 IMAGING — DX DG ABDOMEN 1V
1 series · 1 of 1 positions shown · non-contrast
Comparison: CT 01/26/2020, radiograph 01/18/2009

CLINICAL DATA: NG placement

EXAM:
ABDOMEN - 1 VIEW

[abdomen erect]
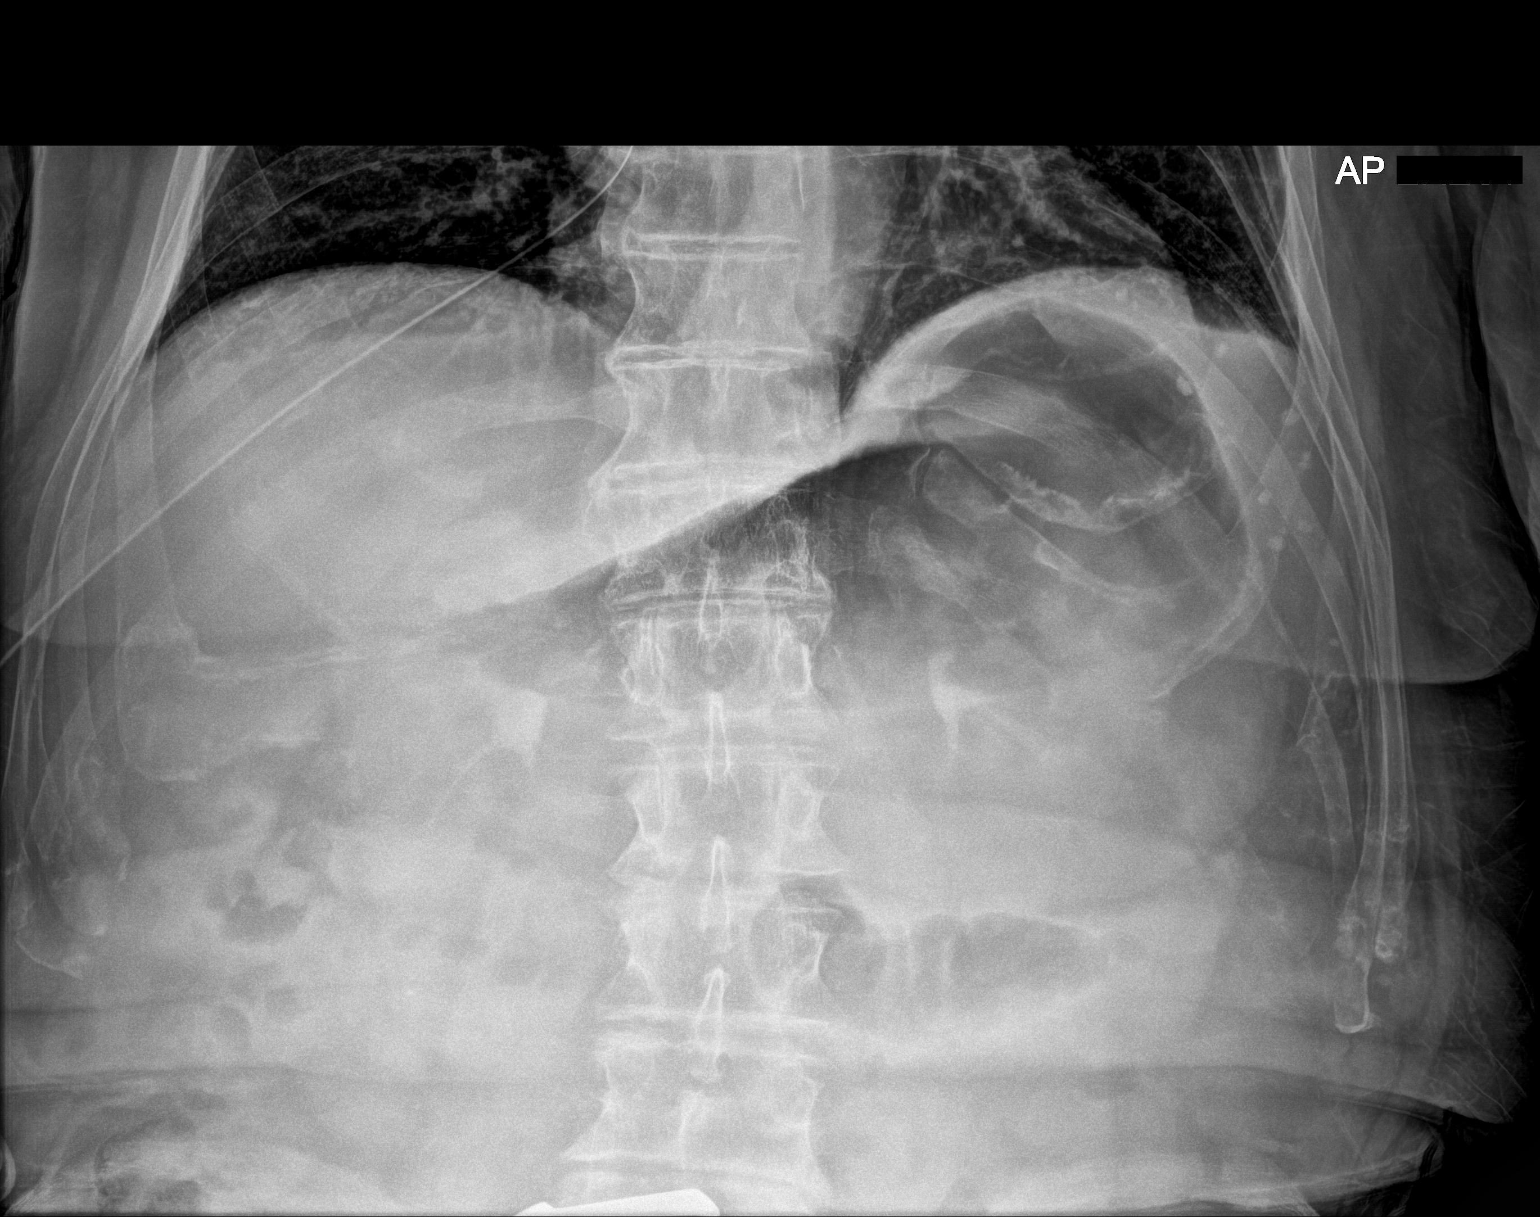

[1 of 1 positions shown; findings below may reference images not displayed]

FINDINGS: The tip of the NG tube terminates in the lower thoracic esophagus.
Lung bases are clear. Included cardiomediastinal contours are
unremarkable. Mild gaseous distention of the stomach with few
air-filled loops of small bowel in the upper abdomen, possibly
reflective of early obstruction, better assessed on CT. Degenerative
changes in the spine.
IMPRESSION: The tip of the NG tube terminates in the lower thoracic esophagus.
Tip of approximately 6 cm proximal to the GE junction. Recommend
advancing approximately 10-15 cm and reimaging.

## 2022-03-23 IMAGING — CT CT ENTEROGRAPHY (ABD-PELV W/ CM)
2 of 6 series · 16 of 46 positions shown, 18 images · IV contrast (APPLIED)
Comparison: 01/26/2020

CLINICAL DATA: Recurrent small-bowel obstructions. Change in bowel
habits. Personal history of prostate carcinoma.

EXAM:
CT ABDOMEN AND PELVIS WITH CONTRAST (ENTEROGRAPHY)
TECHNIQUE: Multidetector CT of the abdomen and pelvis during bolus
administration of intravenous contrast. Negative oral contrast was
given.
CONTRAST:  100mL OMNIPAQUE IOHEXOL 300 MG/ML  SOLN

[Series 3: entero thins · axial · 0.83mm/px · z∈[-565,-191]mm · 13 of 211 slices shown, 15 images]
[im 12/211  soft-tissue]
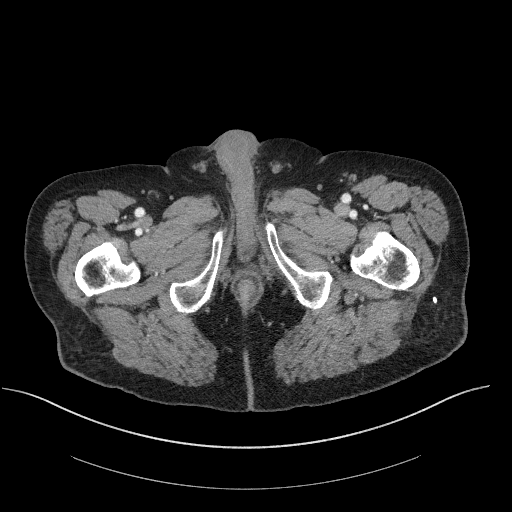
[im 12/211  bone]
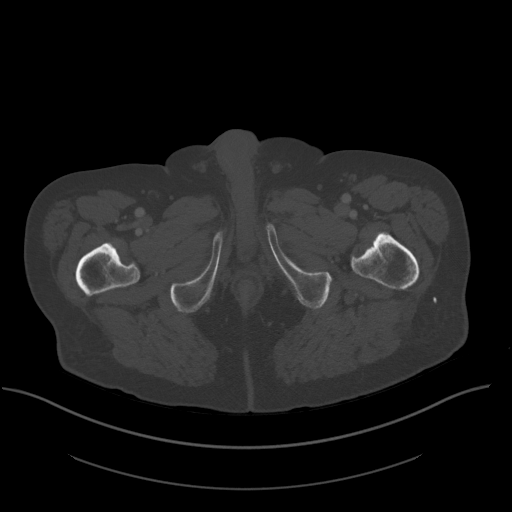
[im 24/211  soft-tissue]
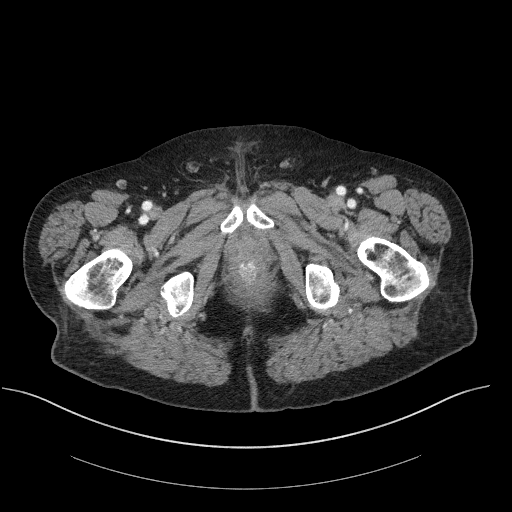
[im 47/211  soft-tissue]
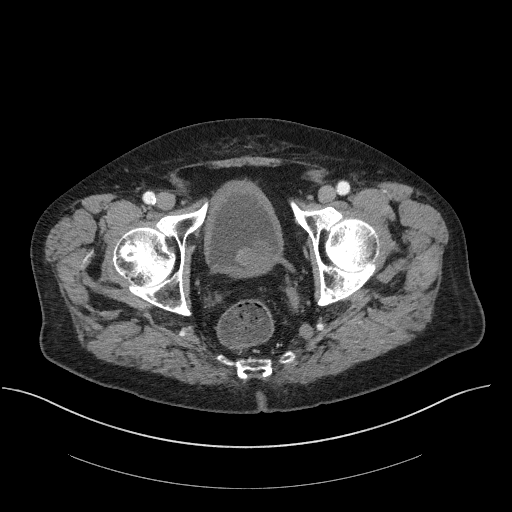
[im 59/211  soft-tissue]
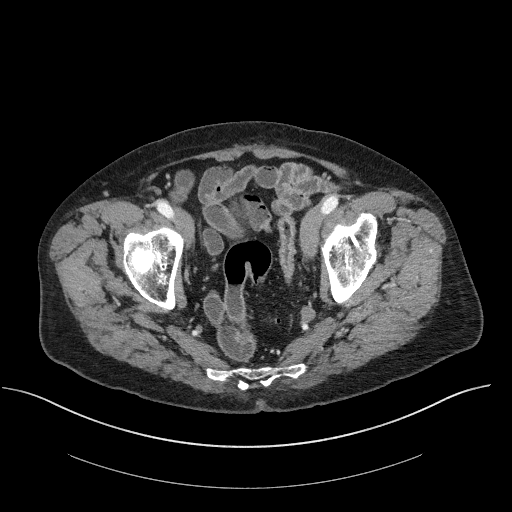
[im 71/211  soft-tissue]
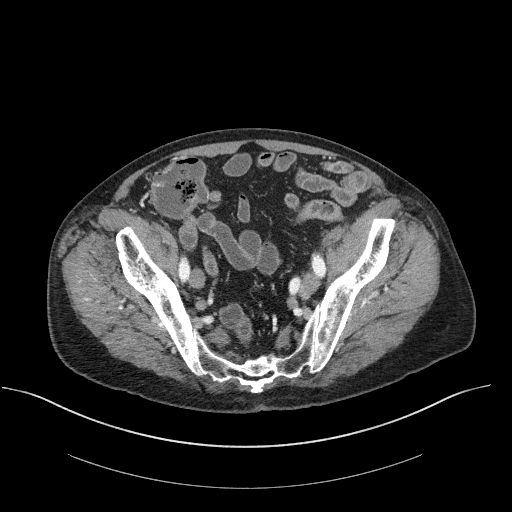
[im 94/211  soft-tissue]
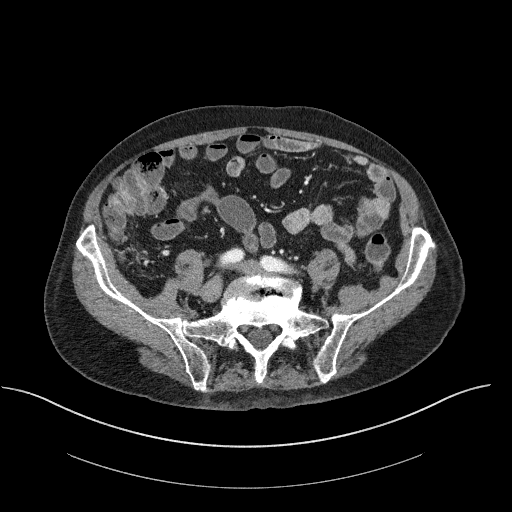
[im 106/211  soft-tissue]
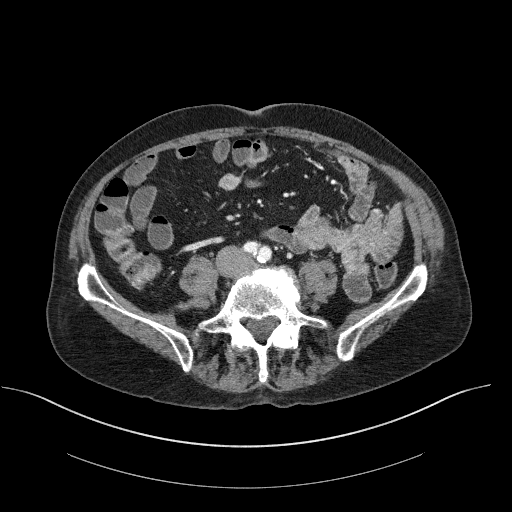
[im 117/211  soft-tissue]
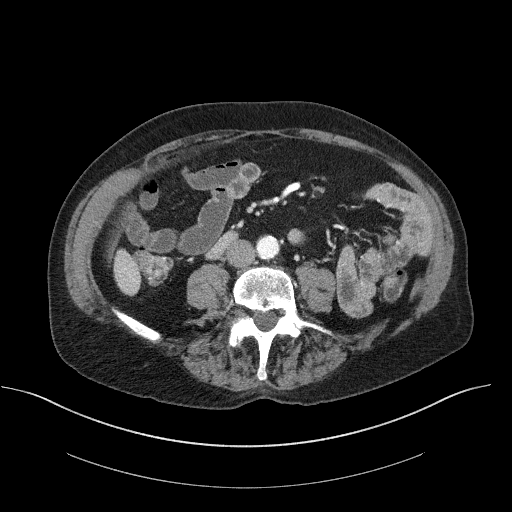
[im 141/211  soft-tissue]
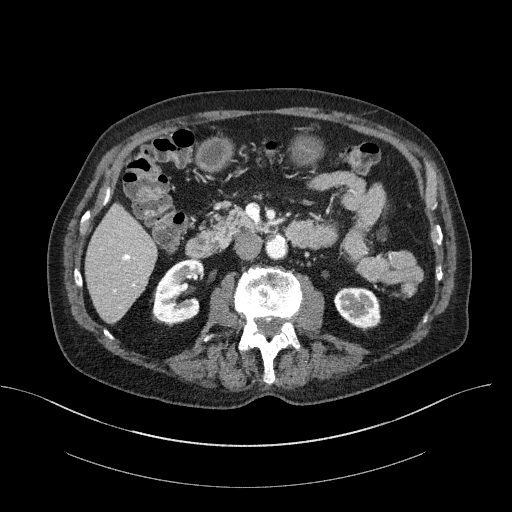
[im 141/211  bone]
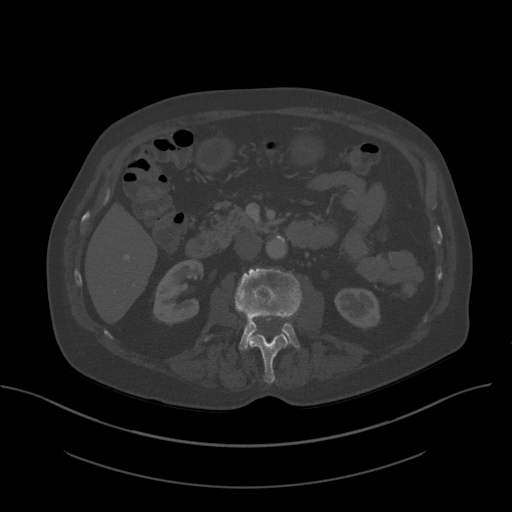
[im 152/211  soft-tissue]
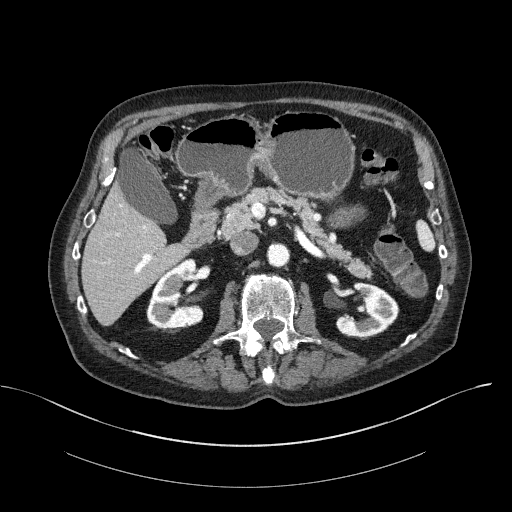
[im 164/211  soft-tissue]
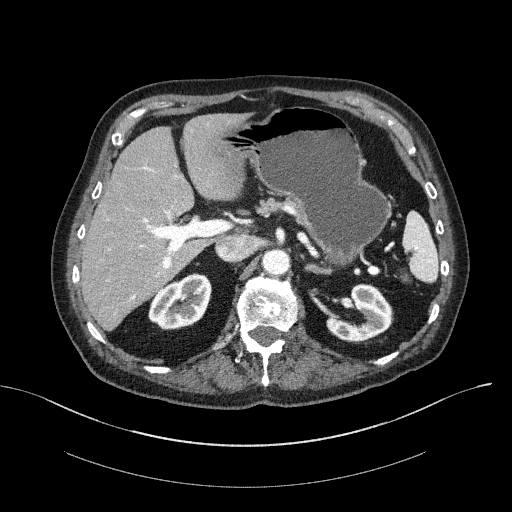
[im 187/211  soft-tissue]
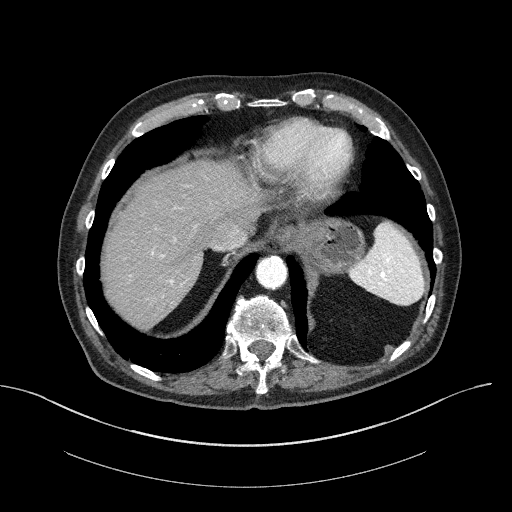
[im 199/211  soft-tissue]
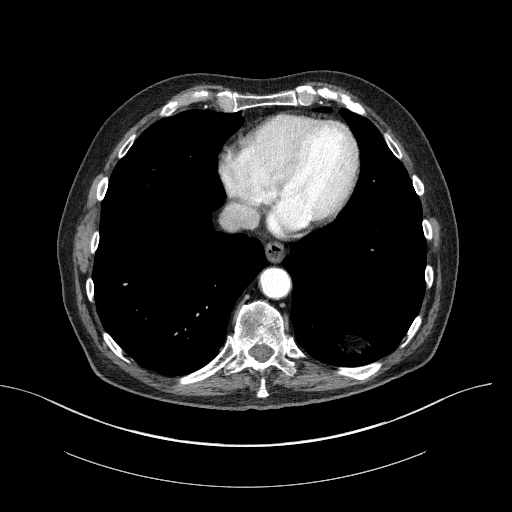

[Series 6: coronal · coronal · 0.75mm/px · 3 of 97 slices shown]
[im 33/97  soft-tissue]
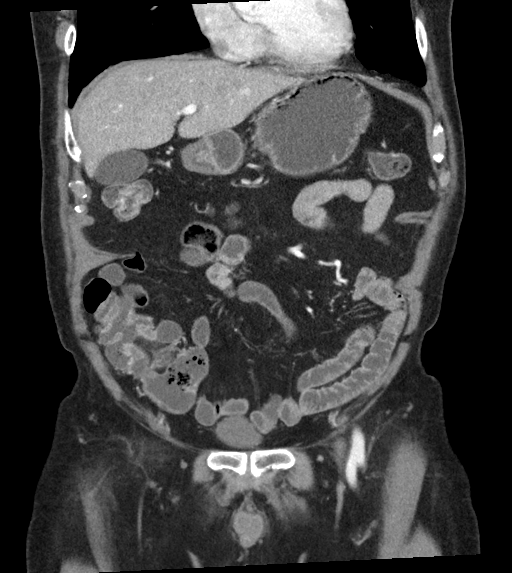
[im 43/97  soft-tissue]
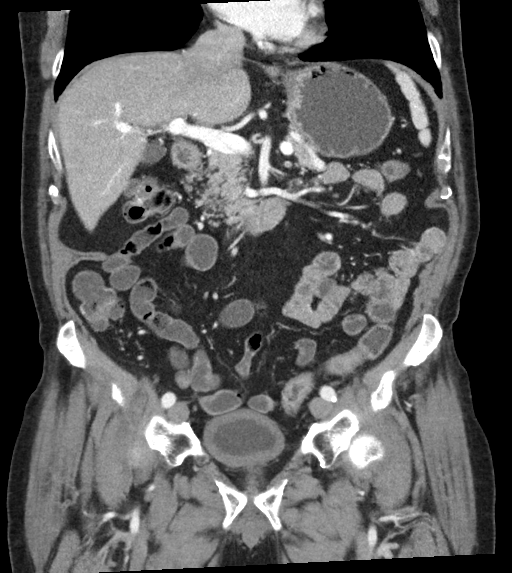
[im 54/97  soft-tissue]
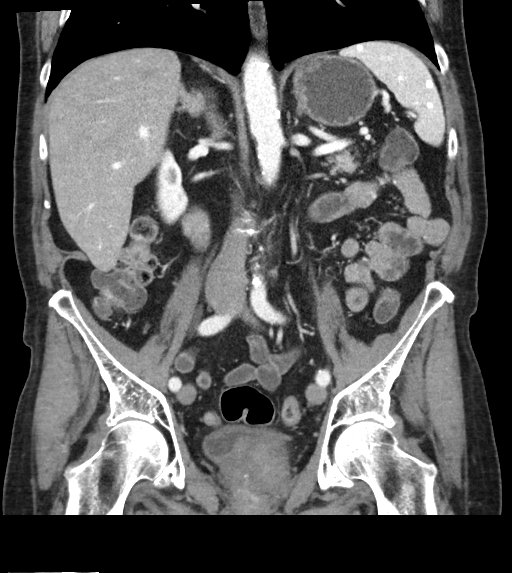

[16 of 46 positions shown; findings below may reference images not displayed]

FINDINGS: Lower Chest: No acute findings.

Hepatobiliary: No hepatic masses identified. Gallbladder is
unremarkable. No evidence of biliary ductal dilatation.

Pancreas:  No mass or inflammatory changes.

Spleen: Within normal limits in size and appearance.

Adrenals/Urinary Tract: No masses identified. No evidence of
ureteral calculi or hydronephrosis. Diffuse bladder wall thickening
is seen, likely due to chronic bladder outlet obstruction given
enlarged prostate.

Stomach/Bowel: Small bowel dilatation has resolved since previous
study. No evidence of bowel wall thickening, abnormal contrast
enhancement, or dilatation. No evidence of mesenteric inflammatory
changes, enteric fistula, or abnormal fluid collections. The
terminal ileum is normal in appearance.

Vascular/Lymphatic: No pathologically enlarged lymph nodes. No
abdominal aortic aneurysm. Aortic atherosclerosis noted.

Reproductive: Stable mildly enlarged prostate gland with indentation
of bladder base.

Other:  None.

Musculoskeletal:  No suspicious bone lesions identified.
IMPRESSION: Interval resolution of small bowel obstruction since previous study.
No radiographic evidence of inflammatory bowel disease or other
etiology. No acute findings.

Stable mildly enlarged prostate, and findings of chronic bladder
outlet obstruction.

Aortic Atherosclerosis (4ODN9-SGD.D).

## 2022-04-06 ENCOUNTER — Encounter: Payer: Self-pay | Admitting: Emergency Medicine

## 2022-04-06 ENCOUNTER — Emergency Department
Admission: EM | Admit: 2022-04-06 | Discharge: 2022-04-06 | Disposition: A | Discharge status: Home / Self Care | Payer: Medicare HMO | Attending: Emergency Medicine | Admitting: Emergency Medicine

## 2022-04-06 ENCOUNTER — Other Ambulatory Visit: Payer: Self-pay

## 2022-04-06 DIAGNOSIS — U071 COVID-19: Secondary | ICD-10-CM | POA: Diagnosis not present

## 2022-04-06 DIAGNOSIS — R509 Fever, unspecified: Secondary | ICD-10-CM | POA: Diagnosis present

## 2022-04-06 DIAGNOSIS — R5081 Fever presenting with conditions classified elsewhere: Secondary | ICD-10-CM

## 2022-04-06 MED ORDER — ONDANSETRON 4 MG PO TBDP: 4.0000 mg | ORAL_TABLET | Freq: Three times a day (TID) | ORAL | 0 refills | Status: DC | PRN

## 2022-04-06 NOTE — ED Triage Notes (Signed)
Pt here with a fever. Pt tested positive for covid on Thurs and was given medicine to take. Pt states he is weak and has not eaten much.

## 2022-04-06 NOTE — ED Provider Notes (Signed)
[facility] Provider Note    Event Date/Time   First MD Initiated Contact with Patient 04/06/22 1423     (approximate)   History   Fever   HPI  Christian Garcia is a 88 y.o. male who was diagnosed with coronavirus in August 31  He reports he is very healthy, he just felt that around August 30 that he had a slight fever and had noticed that he felt rather hot and tired after mowing the lawn with a runny nose and slight cough which is atypical  He has been doing well.  He is completed a course of antiviral medication.  Comes today because he feels like he is just not got his full appetite.  By this he advises that he got breakfast today, ate a biscuit, had fluids, and ate a full breakfast but instead of taking about 20 minutes he had to sort of work through it over the course of about an hour as he reports he just felt sort of like upset stomach.  No nausea or vomiting no abdominal pain.  Just reports he just does not have much appetite, he thinks the medication that he was taking for coronavirus was probably causing it.  When today he was out in his yard walking about, and just reports he does feel slightly more fatigued than typical.  Does not feel lightheaded, no chest pain no trouble breathing.  Reports he is not even really having a cough now just some mild nasal congestion and a slight temperature like 99 something  Physical Exam   Triage Vital Signs: ED Triage Vitals [04/06/22 1409]  Enc Vitals Group     BP 136/80     Pulse Rate 70     Resp 16     Temp 98.5 F (36.9 C)     Temp Source Oral     SpO2 97 %     Weight 184 lb 1.4 oz (83.5 kg)     Height '5\' 10"'$  (1.778 m)     Head Circumference      Peak Flow      Pain Score 0     Pain Loc      Pain Edu?      Excl. in [place]?     Most recent vital signs: Vitals:   04/06/22 1409  BP: 136/80  Pulse: 70  Resp: 16  Temp: 98.5 F (36.9 C)  SpO2: 97%     General: Awake, no distress.  Very pleasant.   Fully alert.  Ambulates without difficulty back and forth to the room. CV:  Good peripheral perfusion.  Normal tones no murmurs.  Normal rate Resp:  Normal effort.  Clear bilaterally.  Speaks in full clear sentences.  No increased work of breathing Slightly hard of hearing Abd:  No distention.  Soft nontender nondistended.  Denies abdominal pain.  Currently reports is not having any nausea and really reports his symptoms are more like just not a lot of hunger just takes him a while to eat more than typical Other:  Warm well-perfused extremities.  Fully alert well oriented   ED Results / Procedures / Treatments   Labs (all labs ordered are listed, but only abnormal results are displayed) Labs Reviewed - No data to display   EKG     RADIOLOGY     PROCEDURES:  Critical Care performed: No  Procedures   MEDICATIONS ORDERED IN ED: Medications - No data to display   IMPRESSION /  MDM / ASSESSMENT AND PLAN / ED COURSE  I reviewed the triage vital signs and the nursing notes.                              Differential diagnosis includes, but is not limited to, COVID-19 infection, gastritis, gastroenteritis, medication side effect etc.  Vitals:   04/06/22 1409  BP: 136/80  Pulse: 70  Resp: 16  Temp: 98.5 F (36.9 C)  SpO2: 97%    Normal examination.  Normal work of breathing.  Very reassuring exam.  At this point he reports just having some mild slight perhaps nausea or as he describes it more just a lack of appetite.  But yet he still eating fairly well just taking longer to consume his normal diet.  For instance he reports he ate a biscuit and full breakfast today but it just took instead of 20 minutes about an hour sort of had to had to work through it.  Abdominal pulmonary neurologic vascular cardiac exams all very reassuring and normal at this time.  Patient's presentation is most consistent with acute, uncomplicated illness.  Discussed with patient, he seems to  be doing quite well and at this juncture shows no evidence of severe COVID-19 or evidence for need for hospitalization.  Very reassuring exam appears well-hydrated alert oriented normal hemodynamics ambulatory without distress.  He is comfortable with prescription for nausea medication which she can use as needed, and careful return precautions with we have discussed.  Return precautions and treatment recommendations and follow-up discussed with the patient who is agreeable with the plan.   FINAL CLINICAL IMPRESSION(S) / ED DIAGNOSES   Final diagnoses:  COVID-19  Fever in other diseases     Rx / DC Orders   ED Discharge Orders          Ordered    ondansetron (ZOFRAN-ODT) 4 MG disintegrating tablet  Every 8 hours PRN        04/06/22 1508             Note:  This document was prepared using Dragon voice recognition software and may include unintentional dictation errors.   Delman Kitten, MD 04/06/22 1538

## 2022-05-02 NOTE — Progress Notes (Unsigned)
12:03 PM   EDDER Garcia 05-02-1929 [id number]  Referring provider: Lauro Regulus, MD [address] [facility] [place],  [place] [address]  Urological history: 1. Prostate cancer -PSA pending -cryoablation in September 2009 for a Gleason's 7 adenocarcinoma of the prostate  2. BPH with LU TS -prostate volume 59 cc on 2021 CT -I PSS ***  No chief complaint on file.    HPI: Christian Garcia is a 88 y.o. male who presents today for yearly follow up.        Score:  1-7 Mild 8-19 Moderate 20-35 Severe  PMH: Past Medical History:  Diagnosis Date   Altered bowel habits    Anemia    BPH (benign prostatic hypertrophy)    COPD (chronic obstructive pulmonary disease) (HCC)    Elevated PSA    History of small bowel obstruction    Hypothyroidism    Peyronie's disease    Prostate cancer [facility])     Surgical History: Past Surgical History:  Procedure Laterality Date   APPENDECTOMY     CATARACT EXTRACTION W/PHACO Left 03/24/2017   Procedure: CATARACT EXTRACTION PHACO AND INTRAOCULAR LENS PLACEMENT (IOC) LEFT;  Surgeon: Lockie Mola, MD;  Location: [facility];  Service: Ophthalmology;  Laterality: Left;   CATARACT EXTRACTION W/PHACO Right 05/05/2017   Procedure: CATARACT EXTRACTION PHACO AND INTRAOCULAR LENS PLACEMENT (IOC) RIGHT;  Surgeon: Lockie Mola, MD;  Location: [facility];  Service: Ophthalmology;  Laterality: Right;   COLONOSCOPY WITH PROPOFOL N/A 02/26/2020   Procedure: COLONOSCOPY WITH PROPOFOL;  Surgeon: Regis Bill, MD;  Location: [facility];  Service: Endoscopy;  Laterality: N/A;   CRYOABLATION  2009   prostate   ESOPHAGOGASTRODUODENOSCOPY (EGD) WITH PROPOFOL N/A 02/26/2020   Procedure: ESOPHAGOGASTRODUODENOSCOPY (EGD) WITH PROPOFOL;  Surgeon: Regis Bill, MD;  Location: [facility] END[facility];  Service: Endoscopy;  Laterality: N/A;   FOOT SURGERY  right   ganglion cyst removed   PROSTATE  SURGERY      Home Medications:  Allergies as of 05/04/2022       Reactions   Sulfa Antibiotics Other (See Comments)   Red skin        Medication List        Accurate as of May 02, 2022 12:03 PM. If you have any questions, ask your nurse or doctor.          Cyanocobalamin 1000 MCG/ML Kit Inject 1,000 mcg into the muscle every 30 (thirty) days.   levothyroxine 75 MCG tablet Commonly known as: SYNTHROID Take 75 mcg by mouth daily before breakfast.   ondansetron 4 MG disintegrating tablet Commonly known as: ZOFRAN-ODT Take 1 tablet (4 mg total) by mouth every 8 (eight) hours as needed for nausea or vomiting.        Allergies:  Allergies  Allergen Reactions   Sulfa Antibiotics Other (See Comments)    Red skin    Family History: Family History  Problem Relation Age of Onset   Prostate cancer Brother        x 3   Breast cancer Sister    Kidney disease Neg Hx    Kidney cancer Neg Hx    Bladder Cancer Neg Hx     Social History:  reports that he has never smoked. He has never used smokeless tobacco. He reports that he does not drink alcohol and does not use drugs.  ROS: For pertinent review of systems please refer to history of present illness  Physical  Exam: There were no vitals taken for this visit.  Constitutional:  Well nourished. Alert and oriented, No acute distress. HEENT: [place] AT, mask in place.  Trachea midline Cardiovascular: No clubbing, cyanosis, or edema. Respiratory: Normal respiratory effort, no increased work of breathing. Neurologic: Grossly intact, no focal deficits, moving all 4 extremities. Psychiatric: Normal mood and affect.    Laboratory Data: WBC (White Blood Cell Count) 4.1 - 10.2 10^3/uL 6.0   RBC (Red Blood Cell Count) 4.69 - 6.13 10^6/uL 4.25 Low    Hemoglobin 14.1 - 18.1 gm/dL 12.4 Low    Hematocrit 40.0 - 52.0 % 36.0 Low    MCV (Mean Corpuscular Volume) 80.0 - 100.0 fl 84.7   MCH (Mean Corpuscular Hemoglobin) 27.0 -  31.2 pg 29.2   MCHC (Mean Corpuscular Hemoglobin Concentration) 32.0 - 36.0 gm/dL 34.4   Platelet Count 150 - 450 10^3/uL 233   RDW-CV (Red Cell Distribution Width) 11.6 - 14.8 % 12.1   MPV (Mean Platelet Volume) 9.4 - 12.4 fl 10.3   Neutrophils 1.50 - 7.80 10^3/uL 4.19   Lymphocytes 1.00 - 3.60 10^3/uL 1.23   Monocytes 0.00 - 1.50 10^3/uL 0.53   Eosinophils 0.00 - 0.55 10^3/uL 0.00   Basophils 0.00 - 0.09 10^3/uL 0.01   Neutrophil % 32.0 - 70.0 % 70.0   Lymphocyte % 10.0 - 50.0 % 20.6   Monocyte % 4.0 - 13.0 % 8.9   Eosinophil % 1.0 - 5.0 % 0.0 Low    Basophil% 0.0 - 2.0 % 0.2   Immature Granulocyte % <=0.7 % 0.3   Immature Granulocyte Count <=0.06 10^3/L 0.02   Resulting Agency  [facility] - LAB   Specimen Collected: 04/13/22 14:12   Performed by: [facility] - LAB Last Resulted: 04/13/22 14:41  Received From: [facility]  Result Received: 05/02/22 12:03   Glucose 70 - 110 mg/dL 99   Sodium 136 - 145 mmol/L 140   Potassium 3.6 - 5.1 mmol/L 4.5   Chloride 97 - 109 mmol/L 104   Carbon Dioxide (CO2) 22.0 - 32.0 mmol/L 29.3   Urea Nitrogen (BUN) 7 - 25 mg/dL 21   Creatinine 0.7 - 1.3 mg/dL 1.1   Glomerular Filtration Rate (eGFR), MDRD Estimate >60 mL/min/1.73sq m 63   Calcium 8.7 - 10.3 mg/dL 9.1   AST  8 - 39 U/L 19   ALT  6 - 57 U/L 13   Alk Phos (alkaline Phosphatase) 34 - 104 U/L 34   Albumin 3.5 - 4.8 g/dL 4.0   Bilirubin, Total 0.3 - 1.2 mg/dL 0.5   Protein, Total 6.1 - 7.9 g/dL 6.2   A/G Ratio 1.0 - 5.0 gm/dL 1.8   Resulting Agency  [facility] - LAB   Specimen Collected: 11/05/21 07:11   Performed by: [facility] - LAB Last Resulted: 11/05/21 10:14  Received From: [facility]  Result Received: 04/06/22 13:32   Vitamin B12 >300 pg/mL 354   Resulting Agency  [facility] - LAB  Narrative Performed by [place] - LAB <200 pg/mL:    Low, consistent with Vitamin B12  Deficiency  200-300 pg/mL: Borderline, possible Vitamin B12 Deficiency  >300 pg/mL:    Normal.  Vitamin B12 Deficiency is unlikely  Specimen Collected: 11/05/21 07:11   Performed by: [facility] - [facility] Last Resulted: 11/05/21 09:54  Received From: [facility]  Result Received: 04/06/22 13:32   Cholesterol, Total 100 - 200 mg/dL  185   Triglyceride 35 - 199 mg/dL 88   HDL (High Density Lipoprotein) Cholesterol 29.0 - 71.0 mg/dL 46.0   LDL Calculated 0 - 130 mg/dL 121   VLDL Cholesterol mg/dL 18   Cholesterol/HDL Ratio  4.0   Resulting Agency  [facility] - LAB   Specimen Collected: 11/05/21 07:11   Performed by: [place]: 11/05/21 09:55  Received From: [facility] [place]  Result Received: 04/06/22 13:32   Thyroid Stimulating Hormone (TSH) 0.450-5.330 uIU/ml uIU/mL 1.691   Resulting Agency  [facility] - LAB   Specimen Collected: 11/05/21 07:11   Performed by: [place]: 11/05/21 09:54  Received From: [facility]  Result Received: 04/06/22 13:32  I have reviewed the labs.     Pertinent imaging No recent imaging  Assessment & Plan:    1. Prostate cancer [facility]):    -PSA pending   2. BPH with LUTS -PSA stable *** -DRE benign *** -UA benign *** -PVR < 300 cc *** -symptoms - *** -most bothersome symptoms are *** -continue conservative management, avoiding bladder irritants and timed voiding's -Initiate alpha-blocker (***), discussed side effects *** -Initiate 5 alpha reductase inhibitor (***), discussed side effects *** -Continue tamsulosin 0.4 mg daily, alfuzosin 10 mg daily, Rapaflo 8 mg daily, terazosin, doxazosin, Cialis 5 mg daily and finasteride 5 mg daily, dutasteride 0.5 mg daily***:refills given -Cannot tolerate medication or medication failure, schedule cystoscopy ***     No follow-ups on file.  [place], [place] [address] [place], [place] [address] [phone]

## 2022-05-04 ENCOUNTER — Other Ambulatory Visit
Admission: RE | Admit: 2022-05-04 | Discharge: 2022-05-04 | Disposition: A | Discharge status: Home / Self Care | Payer: Medicare HMO | Attending: Urology | Admitting: Urology

## 2022-05-04 ENCOUNTER — Ambulatory Visit: Payer: Medicare HMO | Admitting: Urology

## 2022-05-04 ENCOUNTER — Encounter: Payer: Self-pay | Admitting: Urology

## 2022-05-04 ENCOUNTER — Other Ambulatory Visit: Payer: Self-pay | Admitting: *Deleted

## 2022-05-04 VITALS — BP 160/79 | HR 62 | Ht 70.0 in | Wt 180.0 lb

## 2022-05-04 DIAGNOSIS — Z8546 Personal history of malignant neoplasm of prostate: Secondary | ICD-10-CM

## 2022-05-04 DIAGNOSIS — C61 Malignant neoplasm of prostate: Secondary | ICD-10-CM | POA: Insufficient documentation

## 2022-05-04 DIAGNOSIS — N138 Other obstructive and reflux uropathy: Secondary | ICD-10-CM | POA: Diagnosis not present

## 2022-05-04 DIAGNOSIS — N401 Enlarged prostate with lower urinary tract symptoms: Secondary | ICD-10-CM

## 2022-05-04 LAB — PSA: Prostatic Specific Antigen: 0.72 ng/mL (ref 0.00–4.00)

## 2023-01-06 ENCOUNTER — Emergency Department
Admission: EM | Admit: 2023-01-06 | Discharge: 2023-01-06 | Disposition: A | Discharge status: Home / Self Care | Payer: Medicare HMO | Attending: Emergency Medicine | Admitting: Emergency Medicine

## 2023-01-06 ENCOUNTER — Other Ambulatory Visit: Payer: Self-pay

## 2023-01-06 DIAGNOSIS — R7401 Elevation of levels of liver transaminase levels: Secondary | ICD-10-CM | POA: Diagnosis not present

## 2023-01-06 DIAGNOSIS — R110 Nausea: Secondary | ICD-10-CM

## 2023-01-06 DIAGNOSIS — D72829 Elevated white blood cell count, unspecified: Secondary | ICD-10-CM | POA: Diagnosis not present

## 2023-01-06 DIAGNOSIS — R112 Nausea with vomiting, unspecified: Secondary | ICD-10-CM | POA: Insufficient documentation

## 2023-01-06 LAB — CBC
HCT: 38.7 % — ABNORMAL LOW (ref 39.0–52.0)
Hemoglobin: 12.8 g/dL — ABNORMAL LOW (ref 13.0–17.0)
MCH: 29.2 pg (ref 26.0–34.0)
MCHC: 33.1 g/dL (ref 30.0–36.0)
MCV: 88.4 fL (ref 80.0–100.0)
Platelets: 162 10*3/uL (ref 150–400)
RBC: 4.38 MIL/uL (ref 4.22–5.81)
RDW: 12.2 % (ref 11.5–15.5)
WBC: 8.1 10*3/uL (ref 4.0–10.5)
nRBC: 0 % (ref 0.0–0.2)

## 2023-01-06 LAB — COMPREHENSIVE METABOLIC PANEL
ALT: 13 U/L (ref 0–44)
AST: 21 U/L (ref 15–41)
Albumin: 4.2 g/dL (ref 3.5–5.0)
Alkaline Phosphatase: 43 U/L (ref 38–126)
Anion gap: 7 (ref 5–15)
BUN: 22 mg/dL (ref 8–23)
CO2: 27 mmol/L (ref 22–32)
Calcium: 9.1 mg/dL (ref 8.9–10.3)
Chloride: 101 mmol/L (ref 98–111)
Creatinine, Ser: 1.1 mg/dL (ref 0.61–1.24)
GFR, Estimated: >60 mL/min (ref >60)
Glucose, Bld: 133 mg/dL — ABNORMAL HIGH (ref 70–99)
Potassium: 4.7 mmol/L (ref 3.5–5.1)
Sodium: 135 mmol/L (ref 135–145)
Total Bilirubin: 0.7 mg/dL (ref 0.3–1.2)
Total Protein: 7 g/dL (ref 6.5–8.1)

## 2023-01-06 LAB — LIPASE, BLOOD: Lipase: 29 U/L (ref 11–51)

## 2023-01-06 MED ORDER — ONDANSETRON HCL 4 MG PO TABS: 4.0000 mg | ORAL_TABLET | Freq: Three times a day (TID) | ORAL | 0 refills | Status: DC | PRN

## 2023-01-06 NOTE — ED Provider Notes (Signed)
[facility] Provider Note    Event Date/Time   First MD Initiated Contact with Patient 01/06/23 1502     (approximate)   History   Emesis   HPI  Christian Garcia is a 89 y.o. male who presents to the emergency department today because of concerns for nausea and vomiting.  The symptoms started last night.  Patient states that he many different things yesterday including [place] and fast food hamburger.  Last night he started having nausea.  He had multiple episodes of dry heaving.  At one time did throw up some of the food he had eaten.  He denies any associated abdominal pain. States he has had small bowel obstruction in the past but this does not remind him of that episode. Has been passing gas today. No bowel movement but states it is not unlikely for him to go a day or two without bowel movements. No fevers.     Physical Exam   Triage Vital Signs: ED Triage Vitals  Enc Vitals Group     BP 01/06/23 1305 (!) 151/80     Pulse Rate 01/06/23 1305 76     Resp 01/06/23 1305 18     Temp 01/06/23 1305 98.3 F (36.8 C)     Temp Source 01/06/23 1305 Oral     SpO2 01/06/23 1305 95 %     Weight 01/06/23 1310 180 lb (81.6 kg)     Height 01/06/23 1310 5\' 10"  (1.778 m)     Head Circumference --      Peak Flow --      Pain Score 01/06/23 1310 0     Pain Loc --      Pain Edu? --      Excl. in GC? --     Most recent vital signs: Vitals:   01/06/23 1305  BP: (!) 151/80  Pulse: 76  Resp: 18  Temp: 98.3 F (36.8 C)  SpO2: 95%   General: Awake, alert, oriented. CV:  Good peripheral perfusion. Regular rate and rhythm. Resp:  Normal effort. Lungs clear. Abd:  No distention. Non tender. Non tympanitic.    ED Results / Procedures / Treatments   Labs (all labs ordered are listed, but only abnormal results are displayed) Labs Reviewed  COMPREHENSIVE METABOLIC PANEL - Abnormal; Notable for the following components:      Result Value   Glucose, Bld 133 (*)     All other components within normal limits  CBC - Abnormal; Notable for the following components:   Hemoglobin 12.8 (*)    HCT 38.7 (*)    All other components within normal limits  LIPASE, BLOOD  URINALYSIS, ROUTINE W REFLEX MICROSCOPIC     EKG  None   RADIOLOGY None  PROCEDURES:  Critical Care performed: No   MEDICATIONS ORDERED IN ED: Medications - No data to display   IMPRESSION / MDM / ASSESSMENT AND PLAN / ED COURSE  I reviewed the triage vital signs and the nursing notes.                              Differential diagnosis includes, but is not limited to, SBO, volvulus, gastroenteritis, pancreatitis  Patient's presentation is most consistent with acute presentation with potential threat to life or bodily function.   Patient presented to the emergency department today because of concerns for nausea and vomiting.  The time my exam patient states he feels  better.  Abdomen is soft and nontender.  No distention or tympany.  Blood work without concerning LFT elevation or leukocytosis.  This time I do think likely gastroenteritis.  Patient to be varied diet yesterday.  Given his history of small bowel obstruction I did offer to obtain a CT to further evaluate however patient felt comfortable deferring at this time which I think is reasonable.  We did discuss strict return precautions.  Will plan on giving a prescription for Zofran to help symptoms at home.  FINAL CLINICAL IMPRESSION(S) / ED DIAGNOSES   Final diagnoses:  Nausea       Note:  This document was prepared using Dragon voice recognition software and may include unintentional dictation errors.    Phineas Semen, MD 01/06/23 [phone]

## 2023-01-06 NOTE — ED Triage Notes (Signed)
Pt via POV from home. Pt c/o NV since yesterday at noon after he ate some [place] food. Pt states he also feels bloated. Pt is A&Ox4 and NAD

## 2023-01-06 NOTE — ED Notes (Signed)
Pt provided discharge instructions and prescription information. Pt was given the opportunity to ask questions and questions were answered.   

## 2023-01-06 NOTE — Discharge Instructions (Signed)
Please seek medical attention for any high fevers, chest pain, shortness of breath, change in behavior, persistent vomiting, bloody stool or any other new or concerning symptoms.  

## 2023-02-01 DIAGNOSIS — I729 Aneurysm of unspecified site: Secondary | ICD-10-CM

## 2023-02-01 HISTORY — DX: Aneurysm of unspecified site: I72.9

## 2023-02-16 ENCOUNTER — Emergency Department: Payer: Medicare HMO

## 2023-02-16 ENCOUNTER — Observation Stay
Admission: EM | Admit: 2023-02-16 | Discharge: 2023-02-17 | Disposition: A | Discharge status: Home / Self Care | Payer: Medicare HMO | Attending: Internal Medicine | Admitting: Internal Medicine

## 2023-02-16 ENCOUNTER — Encounter: Payer: Self-pay | Admitting: Radiology

## 2023-02-16 ENCOUNTER — Other Ambulatory Visit: Payer: Self-pay

## 2023-02-16 DIAGNOSIS — R29818 Other symptoms and signs involving the nervous system: Secondary | ICD-10-CM

## 2023-02-16 DIAGNOSIS — Z79899 Other long term (current) drug therapy: Secondary | ICD-10-CM | POA: Diagnosis not present

## 2023-02-16 DIAGNOSIS — R7303 Prediabetes: Secondary | ICD-10-CM

## 2023-02-16 DIAGNOSIS — R4701 Aphasia: Principal | ICD-10-CM

## 2023-02-16 DIAGNOSIS — C61 Malignant neoplasm of prostate: Secondary | ICD-10-CM | POA: Diagnosis present

## 2023-02-16 DIAGNOSIS — I10 Essential (primary) hypertension: Secondary | ICD-10-CM

## 2023-02-16 DIAGNOSIS — D631 Anemia in chronic kidney disease: Secondary | ICD-10-CM | POA: Insufficient documentation

## 2023-02-16 DIAGNOSIS — J449 Chronic obstructive pulmonary disease, unspecified: Secondary | ICD-10-CM | POA: Diagnosis not present

## 2023-02-16 DIAGNOSIS — I671 Cerebral aneurysm, nonruptured: Secondary | ICD-10-CM | POA: Diagnosis not present

## 2023-02-16 DIAGNOSIS — Z8546 Personal history of malignant neoplasm of prostate: Secondary | ICD-10-CM | POA: Insufficient documentation

## 2023-02-16 DIAGNOSIS — E039 Hypothyroidism, unspecified: Secondary | ICD-10-CM | POA: Insufficient documentation

## 2023-02-16 DIAGNOSIS — Z8673 Personal history of transient ischemic attack (TIA), and cerebral infarction without residual deficits: Secondary | ICD-10-CM | POA: Diagnosis not present

## 2023-02-16 DIAGNOSIS — I6521 Occlusion and stenosis of right carotid artery: Secondary | ICD-10-CM | POA: Insufficient documentation

## 2023-02-16 DIAGNOSIS — I129 Hypertensive chronic kidney disease with stage 1 through stage 4 chronic kidney disease, or unspecified chronic kidney disease: Secondary | ICD-10-CM | POA: Diagnosis not present

## 2023-02-16 DIAGNOSIS — I639 Cerebral infarction, unspecified: Principal | ICD-10-CM

## 2023-02-16 DIAGNOSIS — N1831 Chronic kidney disease, stage 3a: Secondary | ICD-10-CM | POA: Insufficient documentation

## 2023-02-16 DIAGNOSIS — G459 Transient cerebral ischemic attack, unspecified: Secondary | ICD-10-CM | POA: Diagnosis present

## 2023-02-16 LAB — DIFFERENTIAL
Abs Immature Granulocytes: 0.01 10*3/uL (ref 0.00–0.07)
Basophils Absolute: 0 10*3/uL (ref 0.0–0.1)
Basophils Relative: 0 %
Eosinophils Absolute: 0.2 10*3/uL (ref 0.0–0.5)
Eosinophils Relative: 3 %
Immature Granulocytes: 0 %
Lymphocytes Relative: 16 %
Lymphs Abs: 1 10*3/uL (ref 0.7–4.0)
Monocytes Absolute: 0.7 10*3/uL (ref 0.1–1.0)
Monocytes Relative: 12 %
Neutro Abs: 4.2 10*3/uL (ref 1.7–7.7)
Neutrophils Relative %: 69 %

## 2023-02-16 LAB — COMPREHENSIVE METABOLIC PANEL
ALT: 39 U/L (ref 0–44)
AST: 29 U/L (ref 15–41)
Albumin: 3.2 g/dL — ABNORMAL LOW (ref 3.5–5.0)
Alkaline Phosphatase: 47 U/L (ref 38–126)
Anion gap: 8 (ref 5–15)
BUN: 20 mg/dL (ref 8–23)
CO2: 24 mmol/L (ref 22–32)
Calcium: 8.6 mg/dL — ABNORMAL LOW (ref 8.9–10.3)
Chloride: 97 mmol/L — ABNORMAL LOW (ref 98–111)
Creatinine, Ser: 0.99 mg/dL (ref 0.61–1.24)
GFR, Estimated: >60 mL/min (ref >60)
Glucose, Bld: 124 mg/dL — ABNORMAL HIGH (ref 70–99)
Potassium: 4.1 mmol/L (ref 3.5–5.1)
Sodium: 129 mmol/L — ABNORMAL LOW (ref 135–145)
Total Bilirubin: 0.4 mg/dL (ref 0.3–1.2)
Total Protein: 6.7 g/dL (ref 6.5–8.1)

## 2023-02-16 LAB — HEMOGLOBIN A1C
Hgb A1c MFr Bld: 6.3 % — ABNORMAL HIGH (ref 4.8–5.6)
Mean Plasma Glucose: 134.11 mg/dL

## 2023-02-16 LAB — CBC
HCT: 33.5 % — ABNORMAL LOW (ref 39.0–52.0)
Hemoglobin: 11.1 g/dL — ABNORMAL LOW (ref 13.0–17.0)
MCH: 28.4 pg (ref 26.0–34.0)
MCHC: 33.1 g/dL (ref 30.0–36.0)
MCV: 85.7 fL (ref 80.0–100.0)
Platelets: 221 10*3/uL (ref 150–400)
RBC: 3.91 MIL/uL — ABNORMAL LOW (ref 4.22–5.81)
RDW: 12.1 % (ref 11.5–15.5)
WBC: 6.1 10*3/uL (ref 4.0–10.5)
nRBC: 0 % (ref 0.0–0.2)

## 2023-02-16 LAB — APTT: aPTT: 31 seconds (ref 24–36)

## 2023-02-16 LAB — LIPID PANEL
Cholesterol: 166 mg/dL (ref 0–200)
HDL: 27 mg/dL — ABNORMAL LOW (ref >40)
LDL Cholesterol: 113 mg/dL — ABNORMAL HIGH (ref 0–99)
Total CHOL/HDL Ratio: 6.1 RATIO
Triglycerides: 128 mg/dL (ref <150)
VLDL: 26 mg/dL (ref 0–40)

## 2023-02-16 LAB — PROTIME-INR
INR: 1.1 (ref 0.8–1.2)
Prothrombin Time: 14.4 seconds (ref 11.4–15.2)

## 2023-02-16 LAB — ETHANOL: Alcohol, Ethyl (B): <10 mg/dL (ref <10)

## 2023-02-16 LAB — CBG MONITORING, ED: Glucose-Capillary: 114 mg/dL — ABNORMAL HIGH (ref 70–99)

## 2023-02-16 MED ORDER — ENSURE ENLIVE PO LIQD
237.0000 mL | Freq: Two times a day (BID) | ORAL | Status: DC
  Administered 2023-02-17 (×2): 237 mL via ORAL

## 2023-02-16 MED ORDER — ATORVASTATIN CALCIUM 20 MG PO TABS
40.0000 mg | ORAL_TABLET | Freq: Every day | ORAL | Status: DC
  Administered 2023-02-16 – 2023-02-17 (×2): 40 mg via ORAL
  Filled 2023-02-16 (×2): qty 2

## 2023-02-16 MED ORDER — STROKE: EARLY STAGES OF RECOVERY BOOK: Freq: Once | Status: AC

## 2023-02-16 MED ORDER — IOHEXOL 350 MG/ML SOLN
75.0000 mL | Freq: Once | INTRAVENOUS | Status: AC | PRN
  Administered 2023-02-16: 75 mL via INTRAVENOUS

## 2023-02-16 MED ORDER — SENNOSIDES-DOCUSATE SODIUM 8.6-50 MG PO TABS: 1.0000 | ORAL_TABLET | Freq: Every evening | ORAL | Status: DC | PRN

## 2023-02-16 MED ORDER — GADOBUTROL 1 MMOL/ML IV SOLN
7.5000 mL | Freq: Once | INTRAVENOUS | Status: AC | PRN
  Administered 2023-02-16: 7.5 mL via INTRAVENOUS

## 2023-02-16 MED ORDER — CLOPIDOGREL BISULFATE 75 MG PO TABS
75.0000 mg | ORAL_TABLET | Freq: Every day | ORAL | Status: DC
  Administered 2023-02-17: 75 mg via ORAL
  Filled 2023-02-16: qty 1

## 2023-02-16 MED ORDER — ACETAMINOPHEN 650 MG RE SUPP: 650.0000 mg | RECTAL | Status: DC | PRN

## 2023-02-16 MED ORDER — ENOXAPARIN SODIUM 40 MG/0.4ML IJ SOSY
40.0000 mg | PREFILLED_SYRINGE | INTRAMUSCULAR | Status: DC
  Administered 2023-02-16 – 2023-02-17 (×2): 40 mg via SUBCUTANEOUS
  Filled 2023-02-16 (×3): qty 0.4

## 2023-02-16 MED ORDER — SODIUM CHLORIDE 0.9% FLUSH
3.0000 mL | Freq: Once | INTRAVENOUS | Status: AC
  Administered 2023-02-16: 3 mL via INTRAVENOUS

## 2023-02-16 MED ORDER — ACETAMINOPHEN 325 MG PO TABS: 650.0000 mg | ORAL_TABLET | ORAL | Status: DC | PRN

## 2023-02-16 MED ORDER — LEVOTHYROXINE SODIUM 50 MCG PO TABS
75.0000 ug | ORAL_TABLET | Freq: Every day | ORAL | Status: DC
  Administered 2023-02-17: 75 ug via ORAL
  Filled 2023-02-16 (×2): qty 1

## 2023-02-16 MED ORDER — ACETAMINOPHEN 160 MG/5ML PO SOLN: 650.0000 mg | ORAL | Status: DC | PRN

## 2023-02-16 MED ORDER — ASPIRIN 81 MG PO TBEC
81.0000 mg | DELAYED_RELEASE_TABLET | Freq: Every day | ORAL | Status: DC
  Administered 2023-02-17: 81 mg via ORAL
  Filled 2023-02-16: qty 1

## 2023-02-16 NOTE — Assessment & Plan Note (Addendum)
History of CKD stage IIIa with GFR essentially near 60 chronically.  Currently at baseline.  - Monitor renal function while admitted

## 2023-02-16 NOTE — Assessment & Plan Note (Signed)
 -   Continue home Synthroid °

## 2023-02-16 NOTE — Assessment & Plan Note (Addendum)
History of hypertension, diet controlled only.  Blood pressure is above goal at this time, however allowing for permissive hypertension while CVA workup is ongoing.  - Allow for permissive hypertension

## 2023-02-16 NOTE — ED Notes (Signed)
Report given to Brandon, RN

## 2023-02-16 NOTE — H&P (Signed)
History and Physical    Patient: Christian Garcia NUU:[id number] DOB: 11-Dec-1928 DOA: 02/16/2023 DOS: the patient was seen and examined on 02/16/2023 PCP: Lauro Regulus, MD  Patient coming from: Home  Chief Complaint:  Chief Complaint  Patient presents with   Possible stroke   HPI: Christian Garcia is a 89 y.o. male with medical history significant of prediabetes, stage IIIa CKD, pernicious anemia, hypothyroidism, hyperlipidemia, COPD, BPH, prostate cancer, who presents to the ED due to difficulty finding words.  Mr. Rhines states that earlier today, he was speaking on the phone with his daughter when he began to have difficulty pronouncing his words and getting them out.  The words he was able to get out sounded garbled.  Symptoms self resolved but then recur.  He feels that resting does make his symptoms get better.  He denies any focal weakness, headache, dizziness, chest pain, shortness of breath, palpitations, nausea, vomiting.  He states he has had some intermittent diarrhea over the last 1 week that is nonmelanotic.  He denies any prior history of stroke.  He notes that over the last 3 months, his appetite has decreased.  Over the last 1 week, he has been having night sweats with low-grade fevers around 99 during the daytime.  His PCP put him on Augmentin, but his symptoms have persisted.  ED course: On arrival to the ED, patient was hypertensive at 151/80 with heart rate of 69.  He was saturating at 100% on room air.  He was afebrile at 98.5. Initial workup demonstrated hemoglobin of 11.1, sodium of 129, chloride 97, glucose 124, creatinine 0.99, with GFR above 60.  INR 1.1.  CT of the head demonstrated no acute hemorrhage or infarct, however 6 mm ovoid structure along the course of the left PCA demonstrating possible aneurysm or AVM.  CTA of the head was ordered with evidence of 5.5 mm aneurysm arising from the left P1 P2 junction.  There is a possible venous anomaly in the base of the  brain associated with asymmetric calcification that could be a venous aneurysm.  Neurology consulted; recommending admission to [facility] with outpatient consideration of angiography.  MRI of the brain with and without contrast is pending.  [facility] contacted for admission.  Review of Systems: As mentioned in the history of present illness. All other systems reviewed and are negative.  Past Medical History:  Diagnosis Date   Altered bowel habits    Anemia    BPH (benign prostatic hypertrophy)    COPD (chronic obstructive pulmonary disease) (HCC)    Elevated PSA    History of small bowel obstruction    Hypothyroidism    Peyronie's disease    Prostate cancer [facility])    Past Surgical History:  Procedure Laterality Date   APPENDECTOMY     CATARACT EXTRACTION W/PHACO Left 03/24/2017   Procedure: CATARACT EXTRACTION PHACO AND INTRAOCULAR LENS PLACEMENT (IOC) LEFT;  Surgeon: Lockie Mola, MD;  Location: [facility];  Service: Ophthalmology;  Laterality: Left;   CATARACT EXTRACTION W/PHACO Right 05/05/2017   Procedure: CATARACT EXTRACTION PHACO AND INTRAOCULAR LENS PLACEMENT (IOC) RIGHT;  Surgeon: Lockie Mola, MD;  Location: [facility];  Service: Ophthalmology;  Laterality: Right;   COLONOSCOPY WITH PROPOFOL N/A 02/26/2020   Procedure: COLONOSCOPY WITH PROPOFOL;  Surgeon: Regis Bill, MD;  Location: [facility] ENDOSCOPY;  Service: Endoscopy;  Laterality: N/A;   CRYOABLATION  2009   prostate   ESOPHAGOGASTRODUODENOSCOPY (EGD) WITH PROPOFOL N/A 02/26/2020   Procedure: ESOPHAGOGASTRODUODENOSCOPY (EGD) WITH  PROPOFOL;  Surgeon: Regis Bill, MD;  Location: [facility] ENDOSCOPY;  Service: Endoscopy;  Laterality: N/A;   FOOT SURGERY  right   ganglion cyst removed   PROSTATE SURGERY     Social History:  reports that he has never smoked. He has never used smokeless tobacco. He reports that he does not drink alcohol and does not use drugs.  Allergies  Allergen Reactions   Sulfa  Antibiotics Other (See Comments)    Red skin    Family History  Problem Relation Age of Onset   Prostate cancer Brother        x 3   Breast cancer Sister    Kidney disease Neg Hx    Kidney cancer Neg Hx    Bladder Cancer Neg Hx     Prior to Admission medications   Medication Sig Start Date End Date Taking? Authorizing Provider  amoxicillin (AMOXIL) 500 MG capsule Take 1 capsule by mouth 2 (two) times daily. 02/12/23 02/19/23 Yes [provider]  amoxicillin-clavulanate (AUGMENTIN) 875-125 MG tablet Take 1 tablet by mouth 2 (two) times daily. 02/11/23  Yes [provider]  Cyanocobalamin 1000 MCG/ML KIT Inject 1,000 mcg into the muscle every 30 (thirty) days.     [provider]  levothyroxine (SYNTHROID) 75 MCG tablet Take 75 mcg by mouth daily before breakfast. 08/15/19   [provider]  ondansetron (ZOFRAN) 4 MG tablet Take 1 tablet (4 mg total) by mouth every 8 (eight) hours as needed for vomiting or nausea. 01/06/23   Phineas Semen, MD  ondansetron (ZOFRAN-ODT) 4 MG disintegrating tablet Take 1 tablet (4 mg total) by mouth every 8 (eight) hours as needed for nausea or vomiting. 04/06/22   Sharyn Creamer, MD    Physical Exam: Vitals:   02/16/23 1100 02/16/23 1109  BP: (!) 151/80   Pulse: 69   Resp: 18   Temp:  98.5 F (36.9 C)  TempSrc:  Oral  SpO2: 100%    Physical Exam Vitals and nursing note reviewed.  Constitutional:      General: He is not in acute distress.    Appearance: He is normal weight. He is not toxic-appearing.  HENT:     Head: Normocephalic and atraumatic.     Mouth/Throat:     Mouth: Mucous membranes are moist.     Pharynx: Oropharynx is clear.  Eyes:     Extraocular Movements: Extraocular movements intact.     Conjunctiva/sclera: Conjunctivae normal.     Pupils: Pupils are equal, round, and reactive to light.  Cardiovascular:     Rate and Rhythm: Normal rate and regular rhythm.     Heart sounds: No murmur heard.     No gallop.  Pulmonary:     Effort: Pulmonary effort is normal. No respiratory distress.     Breath sounds: Normal breath sounds. No wheezing, rhonchi or rales.  Abdominal:     General: Bowel sounds are normal. There is no distension.     Palpations: Abdomen is soft.     Tenderness: There is no abdominal tenderness. There is no guarding.  Musculoskeletal:     Comments: +1 pitting edema up to the lower calf region  Skin:    General: Skin is warm and dry.  Neurological:     Mental Status: He is alert.     Comments:  Patient is alert and oriented x 3 Patient appears to have intermittent difficulty getting his words out but does not pronounce the incorrect word.  Symptoms come  and go throughout our conversation.  Usually lasting several minutes at a time. No facial asymmetry 5 out of 5 strength throughout Sensation intact throughout  Psychiatric:        Mood and Affect: Mood normal.        Behavior: Behavior normal.    Data Reviewed: CBC with WBC of 6.1, hemoglobin 11.1, MCV of 85 and platelets of 221 CMP with sodium of 129, potassium 4.1, bicarb 24, glucose 124, BUN 20, creatinine 0.99, calcium 8.6, albumin 3.2, AST 29, ALT 39 and GFR above 60 INR 1.1 PTT 31 Alcohol level negative  MR BRAIN W WO CONTRAST  Result Date: 02/16/2023 CLINICAL DATA:  Seizure, new onset with no history of trauma. Acute stroke suspected. Intermittent aphasia. EXAM: MRI HEAD WITHOUT AND WITH CONTRAST TECHNIQUE: Multiplanar, multiecho pulse sequences of the brain and surrounding structures were obtained without and with intravenous contrast. CONTRAST:  7.43mL GADAVIST GADOBUTROL 1 MMOL/ML IV SOLN COMPARISON:  Head CT and CTA from earlier today. FINDINGS: Brain: No acute infarction, hemorrhage, hydrocephalus, extra-axial collection or mass lesion. Generalized cortical atrophy that is mild for age. No focal encephalomalacia. Unremarkable appearance of the medial temporal lobes. Mineralization at the left internal  capsule and adjacent deep gray nuclei reflecting a developmental venous anomaly by prior CTA. No superimposed edema or discrete hemorrhage. Vascular: Developmental venous anomaly is noted above. Enhancing rounded structure ventral to the left cerebral peduncle measuring 5 mm, reference recent CTA description. Skull and upper cervical spine: Hypointense appearance at the left C1 lateral mass is attributed to degenerative sclerosis when correlated with CT. C2-3 intervertebral ankylosis. No evidence of aggressive bone lesion. Sinuses/Orbits: No acute finding IMPRESSION: 1. No acute or reversible finding. No brain mass or focal cortical finding to correlate with seizure history. 2. 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 Electronically Signed   By: Tiburcio Pea M.D.   On: 02/16/2023 14:06   CT ANGIO HEAD NECK W WO CM (CODE STROKE)  Result Date: 02/16/2023 CLINICAL DATA:  Transient ischemic attack.  Speech disturbance. EXAM: CT ANGIOGRAPHY HEAD AND NECK WITH AND WITHOUT CONTRAST TECHNIQUE: Multidetector CT imaging of the head and neck was performed using the standard protocol during bolus administration of intravenous contrast. Multiplanar CT image reconstructions and MIPs were obtained to evaluate the vascular anatomy. Carotid stenosis measurements (when applicable) are obtained utilizing NASCET criteria, using the distal internal carotid diameter as the denominator. RADIATION DOSE REDUCTION: This exam was performed according to the departmental dose-optimization program which includes automated exposure control, adjustment of the mA and/or kV according to patient size and/or use of iterative reconstruction technique. CONTRAST:  75mL OMNIPAQUE IOHEXOL 350 MG/ML SOLN COMPARISON:  Head CT earlier same day. FINDINGS: CTA NECK FINDINGS Aortic arch: Aortic atherosclerosis. Branching pattern is normal without origin stenosis. Right carotid system: Common  carotid artery is tortuous but widely patent to the bifurcation. Calcified plaque in the ICA bulb. Minimal diameter at the distal bulb is 3.9 mm. Compared to a more distal cervical ICA diameter of 6.4 mm, this indicates a 40% stenosis. Left carotid system: Common carotid artery is tortuous but widely patent to the bifurcation. Minimal plaque at the bifurcation but no stenosis. Vertebral arteries: Both vertebral artery origins widely patent. Both vertebral arteries appear normal through the cervical region to the foramen magnum. Skeleton: Chronic fusion at C2 and C3. Ordinary mid cervical spondylosis. Other neck: No mass or lymphadenopathy. Upper chest: Lung apices are clear. Review of the MIP  images confirms the above findings CTA HEAD FINDINGS Anterior circulation: Both internal carotid arteries are patent through the skull base and siphon regions. No siphon stenosis. The anterior and middle cerebral vessels are patent. No large vessel occlusion or proximal stenosis. No aneurysm or vascular malformation. Posterior circulation: Both vertebral arteries are widely patent through the foramen magnum to the basilar artery. No basilar stenosis. Posterior circulation branch vessels are patent. There is a 5.5 mm aneurysm that I think probably arises from the left P1 P2 junction. I do not see a vessel egress from this aneurysm however. 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 Numerous abnormal draining veins from this area do raise the possibility that this could even be a venous aneurysm. The more distal PCA branches on the left are visible, but I do not see an intact PCA from the region of the aneurysm to the more distal PCA on the left. Therefore, the differential diagnosis for this includes arterial berry aneurysm, aneurysm associated with arteriovenous malformation and aneurysm associated with arteriovenous  fistula. I think you would take a catheter angiogram to fully understand this lesion. The advisability of that in this 89 year old patient must be determined clinically. Venous sinuses: Patent and normal Anatomic variants: None other significant. Review of the MIP images confirms the above findings IMPRESSION: 1. 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 I do not see a vessel egress from this aneurysm however. 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 Numerous abnormal draining veins from this area do raise the possibility that this could even be a venous aneurysm. The more distal PCA branches on the left are visible, but I do not see an intact PCA from the region of the aneurysm to the more distal PCA on the left. Therefore, the differential diagnosis for this includes arterial berry aneurysm, aneurysm associated with arteriovenous malformation and aneurysm associated with arteriovenous fistula. I think catheter angiography would be necessary to fully understand this lesion. The advisability of that in this 89 year old patient must be determined clinically. 2. Aortic atherosclerosis. 3. Calcified plaque in the right ICA bulb. 40% stenosis at the distal bulb. 4. No acute intracranial anterior circulation large vessel occlusion or proximal stenosis. Aortic Atherosclerosis (ICD10-I70.0). Electronically Signed   By: Paulina Fusi M.D.   On: 02/16/2023 12:12   CT HEAD WO CONTRAST  Result Date: 02/16/2023 CLINICAL DATA:  rule out stroke. EXAM: CT HEAD WITHOUT CONTRAST TECHNIQUE: Contiguous axial images were obtained from the base of the skull through the vertex without intravenous contrast. RADIATION DOSE REDUCTION: This exam was performed according to the departmental dose-optimization program which includes automated exposure control, adjustment of the mA and/or kV according  to patient size and/or use of iterative reconstruction technique. COMPARISON:  None Available. FINDINGS: Brain: No acute intracranial hemorrhage. Asymmetric mineralization of the left basal ganglia and thalamus. Gray-white differentiation is preserved. No hydrocephalus or extra-axial collection. No mass effect or midline shift. Vascular: 6 mm ovoid structure along the course of the left PCA, possible aneurysm or nidus of arteriovenous malformation. Skull: No calvarial fracture or suspicious bone lesion. Skull base is unremarkable. Sinuses/Orbits: Unremarkable. Other: None. IMPRESSION: 1. No acute intracranial hemorrhage or evidence of acute infarction. 2. 6 mm ovoid structure along the course of the left PCA, possible aneurysm or  nidus of arteriovenous malformation. CTA head is recommended for further evaluation. Code stroke imaging results were communicated on 02/16/2023 at 11:28 am to provider Dr. Selina Cooley via telephone, who verbally acknowledged these results. Electronically Signed   By: Orvan Falconer M.D.   On: 02/16/2023 11:30    Results are pending, will review when available.  Assessment and Plan:  * Expressive aphasia Patient presenting with sudden onset intermittent expressive aphasia.  Differential includes TIA versus CVA, intracranial structural anomaly.  Given history of recent night sweats with low-grade daytime fevers, will also evaluate for any infectious etiology.  Incidentally found small aneurysm is unlikely to be contributing to current presentation.  - Neurology consulted; appreciate their recommendations - MRI brain with and without contrast pending - Telemetry monitoring - Allow for permissive HTN (systolic < 220 and diastolic < 120) - Echocardiogram  - A1C and Lipid panel  - ASA 81 mg daily - Plavix 75 mg daily - Statin   - PT/OT/SLP  Brain aneurysm On CTA, there was a 5.5 mm aneurysm likely arising from the left P1 P2 junction with possible developmental venous anomaly.   Neurology discussed with neuro IR, recommending outpatient evaluation with possible angiography at that time.  No urgent need for intervention at this time.  Essential hypertension History of hypertension, diet controlled only.  Blood pressure is above goal at this time, however allowing for permissive hypertension while CVA workup is ongoing.  - Allow for permissive hypertension  Prediabetes Last A1c of 6.4%.  No indication at this time for SSI.  - A1c pending  Stage 3a chronic kidney disease (HCC) History of CKD stage IIIa with GFR essentially near 60 chronically.  Currently at baseline.  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  Active surveillance since with normal PSA.  Advance Care Planning:   Code Status: Full Code verified by patient  Consults: Neurology  Family Communication: Patient's family member updated at bedside  Severity of Illness: The appropriate patient status for this patient is OBSERVATION. Observation status is judged to be reasonable and necessary in order to provide the required intensity of service to ensure the patient's safety. The patient's presenting symptoms, physical exam findings, and initial radiographic and laboratory data in the context of their medical condition is felt to place them at decreased risk for further clinical deterioration. Furthermore, it is anticipated that the patient will be medically stable for discharge from the hospital within 2 midnights of admission.   Author: Verdene Lennert, MD 02/16/2023 2:30 PM  For on call review [email or web address].

## 2023-02-16 NOTE — ED Notes (Signed)
Assisted pt to the toilet and back to bed, pt fully ambulatory on own.

## 2023-02-16 NOTE — Assessment & Plan Note (Signed)
Last A1c of 6.4%.  No indication at this time for SSI.  - A1c pending

## 2023-02-16 NOTE — Assessment & Plan Note (Signed)
On CTA, there was a 5.5 mm aneurysm likely arising from the left P1 P2 junction with possible developmental venous anomaly.  Neurology discussed with neuro IR, recommending outpatient evaluation with possible angiography at that time.  No urgent need for intervention at this time.

## 2023-02-16 NOTE — Assessment & Plan Note (Signed)
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  Active surveillance since with normal PSA.

## 2023-02-16 NOTE — Consult Note (Signed)
NEUROLOGY CONSULTATION NOTE   Date of service: February 16, 2023 Patient Name: Christian Garcia MRN:  [id number] DOB:  03/19/1929 Reason for consult: stroke code Requesting physician: Dr. Cyril Loosen _ _ _   _ __   _ __ _ _  __ __   _ __   __ _  History of Present Illness   This is a 89 yo man with pmhx COPD, hypothyroidism and prostate cancer who presented with word finding difficulty. LKW prior to arrival was 1000. His aphasia resolved and then recurred 4x between EMS and the ED. He has never had an episode like this before.  On my examination he had NIHSS = 0 and had no aphasia. Head CT showed no e/o hemorrhage or acute infarct but did show a 6mm ovoid structure along the course of the L PCA which per radiology may represent a possible aneurysm or nidus of AVM (personally reviewed and discussed with neuroradiology by phone). TNK was not administered 2/2 sx had resolved. CTA is pending.   ROS   Per HPI: all other systems reviewed and are negative  Past History   I have reviewed the following:  Past Medical History:  Diagnosis Date   Altered bowel habits    Anemia    BPH (benign prostatic hypertrophy)    COPD (chronic obstructive pulmonary disease) (HCC)    Elevated PSA    History of small bowel obstruction    Hypothyroidism    Peyronie's disease    Prostate cancer [facility])    Past Surgical History:  Procedure Laterality Date   APPENDECTOMY     CATARACT EXTRACTION W/PHACO Left 03/24/2017   Procedure: CATARACT EXTRACTION PHACO AND INTRAOCULAR LENS PLACEMENT (IOC) LEFT;  Surgeon: Lockie Mola, MD;  Location: [facility];  Service: Ophthalmology;  Laterality: Left;   CATARACT EXTRACTION W/PHACO Right 05/05/2017   Procedure: CATARACT EXTRACTION PHACO AND INTRAOCULAR LENS PLACEMENT (IOC) RIGHT;  Surgeon: Lockie Mola, MD;  Location: [facility];  Service: Ophthalmology;  Laterality: Right;   COLONOSCOPY WITH PROPOFOL N/A 02/26/2020   Procedure: COLONOSCOPY WITH  PROPOFOL;  Surgeon: Regis Bill, MD;  Location: [facility] ENDOSCOPY;  Service: Endoscopy;  Laterality: N/A;   CRYOABLATION  2009   prostate   ESOPHAGOGASTRODUODENOSCOPY (EGD) WITH PROPOFOL N/A 02/26/2020   Procedure: ESOPHAGOGASTRODUODENOSCOPY (EGD) WITH PROPOFOL;  Surgeon: Regis Bill, MD;  Location: [facility] ENDOSCOPY;  Service: Endoscopy;  Laterality: N/A;   FOOT SURGERY  right   ganglion cyst removed   PROSTATE SURGERY     Family History  Problem Relation Age of Onset   Prostate cancer Brother        x 3   Breast cancer Sister    Kidney disease Neg Hx    Kidney cancer Neg Hx    Bladder Cancer Neg Hx    Social History   Socioeconomic History   Marital status: Single    Spouse name: Not on file   Number of children: Not on file   Years of education: Not on file   Highest education level: Not on file  Occupational History   Not on file  Tobacco Use   Smoking status: Never   Smokeless tobacco: Never  Vaping Use   Vaping status: Never Used  Substance and Sexual Activity   Alcohol use: No    Alcohol/week: 0.0 standard drinks of alcohol   Drug use: No   Sexual activity: Not Currently  Other Topics Concern   Not on file  Social History Narrative  Not on file   Social Determinants of Health   Financial Resource Strain: Not on file  Food Insecurity: Not on file  Transportation Needs: Not on file  Physical Activity: Not on file  Stress: Not on file  Social Connections: Not on file   Allergies  Allergen Reactions   Sulfa Antibiotics Other (See Comments)    Red skin    Medications   (Not in a hospital admission)    No current facility-administered medications for this encounter.  Current Outpatient Medications:    amoxicillin (AMOXIL) 500 MG capsule, Take 1 capsule by mouth 2 (two) times daily., Disp: , Rfl:    amoxicillin-clavulanate (AUGMENTIN) 875-125 MG tablet, Take 1 tablet by mouth 2 (two) times daily., Disp: , Rfl:    Cyanocobalamin 1000 MCG/ML  KIT, Inject 1,000 mcg into the muscle every 30 (thirty) days. , Disp: , Rfl:    levothyroxine (SYNTHROID) 75 MCG tablet, Take 75 mcg by mouth daily before breakfast., Disp: , Rfl:    ondansetron (ZOFRAN) 4 MG tablet, Take 1 tablet (4 mg total) by mouth every 8 (eight) hours as needed for vomiting or nausea., Disp: 20 tablet, Rfl: 0   ondansetron (ZOFRAN-ODT) 4 MG disintegrating tablet, Take 1 tablet (4 mg total) by mouth every 8 (eight) hours as needed for nausea or vomiting., Disp: 20 tablet, Rfl: 0  Vitals   Vitals:   02/16/23 1100 02/16/23 1109  BP: (!) 151/80   Pulse: 69   Resp: 18   Temp:  98.5 F (36.9 C)  TempSrc:  Oral  SpO2: 100%      There is no height or weight on file to calculate BMI.  Physical Exam   Physical Exam Gen: A&O x4, NAD HEENT: Atraumatic, normocephalic;mucous membranes moist; oropharynx clear, tongue without atrophy or fasciculations. Neck: Supple, trachea midline. Resp: CTAB, no w/r/r CV: RRR, no m/g/r; nml S1 and S2. 2+ symmetric peripheral pulses. Abd: soft/NT/ND; nabs x 4 quad Extrem: Nml bulk; no cyanosis, clubbing, or edema.  Neuro: *MS: A&O x4. Follows multi-step commands.  *Speech: fluid, nondysarthric, able to name and repeat *CN:    I: Deferred   II,III: PERRLA, VFF by confrontation, optic discs unable to be visualized 2/2 pupillary constriction   III,IV,VI: EOMI w/o nystagmus, no ptosis   V: Sensation intact from V1 to V3 to LT   VII: Eyelid closure was full.  Smile symmetric.   VIII: Hearing intact to voice   IX,X: Voice normal, palate elevates symmetrically    XI: SCM/trap 5/5 bilat   XII: Tongue protrudes midline, no atrophy or fasciculations   *Motor:   Normal bulk.  No tremor, rigidity or bradykinesia. No pronator drift.    Strength: Dlt Bic Tri WrE WrF FgS Gr HF KnF KnE PlF DoF    Left 5 5 5 5 5 5 5 5 5 5 5 5     Right 5 5 5 5 5 5 5 5 5 5 5 5     *Sensory: Intact to light touch, pinprick, temperature vibration throughout.  Symmetric. Propioception intact bilat.  No double-simultaneous extinction.  *Coordination:  Finger-to-nose, heel-to-shin, rapid alternating motions were intact. *Reflexes:  2+ and symmetric throughout without clonus; toes down-going bilat *Gait: deferred  NIHSS = 0  Premorbid mRS = 1   Labs   CBC:  Recent Labs  Lab 02/16/23 1035  WBC 6.1  NEUTROABS 4.2  HGB 11.1*  HCT 33.5*  MCV 85.7  PLT 221    Basic Metabolic Panel:  Lab Results  Component Value Date   NA 129 (L) 02/16/2023   K 4.1 02/16/2023   CO2 24 02/16/2023   GLUCOSE 124 (H) 02/16/2023   BUN 20 02/16/2023   CREATININE 0.99 02/16/2023   CALCIUM 8.6 (L) 02/16/2023   GFRNONAA >60 02/16/2023   GFRAA >60 01/28/2020   Lipid Panel: No results found for: "LDLCALC" HgbA1c: No results found for: "HGBA1C" Urine Drug Screen: No results found for: "LABOPIA", "COCAINSCRNUR", "LABBENZ", "AMPHETMU", "THCU", "LABBARB"  Alcohol Level     Component Value Date/Time   ETH <10 02/16/2023 1035    Head CT showed no e/o hemorrhage or acute infarct but did show a 6mm ovoid structure along the course of the L PCA which per radiology may represent a possible aneurysm or nidus of AVM (personally reviewed and discussed with neuroradiology by phone).   CTA is pending  Impression   This is a 89 yo man with pmhx COPD, hypothyroidism and prostate cancer who presented with 4 discrete episodes of word-finding difficulty today, currently asymptomatic, c/f crescendo TIA vs less likely seizure.  Head CT showed no e/o hemorrhage or acute infarct but did show a 6mm ovoid structure along the course of the L PCA which per radiology may represent a possible aneurysm or nidus of AVM (personally reviewed and discussed with neuroradiology by phone). Will obtain CTA for further evaluation.  Recommendations   - Admit for stroke workup - Permissive HTN x48 hrs from sx onset or until stroke ruled out by MRI goal BP <220/110. PRN labetalol or  hydralazine if BP above these parameters. Avoid oral antihypertensives. - MRI brain with and without contrast 2/2 hx malignancy and possibility of seizure - CTA H&N pending - TTE  - Check A1c and LDL + add statin per guidelines - ASA 81mg  daily + plavix 75mg  daily x21 days f/b ASA 81mg  daily monotherapy after that - q4 hr neuro checks - STAT head CT for any change in neuro exam - Tele - PT/OT/SLP - Stroke education - Amb referral to neurology upon discharge - rEEG - No indication to start AED at this time unless EEG shows epileptiform abnl  Will continue to follow  ______________________________________________________________________   Thank you for the opportunity to take part in the care of this patient. If you have any further questions, please contact the neurology consultation attending.  Signed,  Bing Neighbors, MD [facility] [phone]  If 7pm- 7am, please page neurology on call as listed in AMION.  **Any copied and pasted documentation in this note was written by me in another application not billed for and pasted by me into this document.  Addendum 1307 on 02/16/23  CTA H&N showed:  1. 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 I do not see a vessel egress from this aneurysm however. 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 Numerous abnormal draining veins from this area do raise the possibility that this could even be a venous aneurysm. The more distal PCA branches on the left are visible, but I do not see an intact PCA from the region of the aneurysm to the more distal PCA on the left. Therefore, the differential diagnosis for this includes arterial berry aneurysm, aneurysm associated with arteriovenous malformation and aneurysm associated with arteriovenous fistula. I think catheter angiography would be  necessary to fully understand this lesion. The advisability of that in this 89 year old patient  must be determined clinically. 2. Aortic atherosclerosis. 3. Calcified plaque in the right ICA bulb. 40% stenosis at the distal bulb. 4. No acute intracranial anterior circulation large vessel occlusion or proximal stenosis.  I discussed this finding with Dr. Corliss Skains of neuroradiology. He favors AVM with adjacent aneurysm although differential includes oligodendroglioma (less likely). It is additionally possible that patient had seizure 2/2 AVM. Dr. Corliss Skains recommended catheter angiogram for further characterization and treatment if indicated based on those results. This may be done as an outpatient; I will arrange close f/u with Dr. Corliss Skains to do this. Will continue to follow.  Bing Neighbors, MD [facility] [phone]  If 7pm- 7am, please page neurology on call as listed in AMION.

## 2023-02-16 NOTE — Consult Note (Incomplete Revision)
NEUROLOGY CONSULTATION NOTE   Date of service: February 16, 2023 Patient Name: Christian Garcia MRN:  [id number] DOB:  Jan 24, 1929 Reason for consult: stroke code Requesting physician: Dr. Cyril Loosen _ _ _   _ __   _ __ _ _  __ __   _ __   __ _  History of Present Illness   This is a 89 yo man with pmhx COPD, hypothyroidism and prostate cancer who presented with word finding difficulty. LKW prior to arrival was 1000. His aphasia resolved and then recurred 4x between EMS and the ED. He has never had an episode like this before.  On my examination he had NIHSS = 0 and had no aphasia. Head CT showed no e/o hemorrhage or acute infarct but did show a 6mm ovoid structure along the course of the L PCA which per radiology may represent a possible aneurysm or nidus of AVM (personally reviewed and discussed with neuroradiology by phone). TNK was not administered 2/2 sx had resolved. CTA is pending.   ROS   Per HPI: all other systems reviewed and are negative  Past History   I have reviewed the following:  Past Medical History:  Diagnosis Date   Altered bowel habits    Anemia    BPH (benign prostatic hypertrophy)    COPD (chronic obstructive pulmonary disease) (HCC)    Elevated PSA    History of small bowel obstruction    Hypothyroidism    Peyronie's disease    Prostate cancer [facility])    Past Surgical History:  Procedure Laterality Date   APPENDECTOMY     CATARACT EXTRACTION W/PHACO Left 03/24/2017   Procedure: CATARACT EXTRACTION PHACO AND INTRAOCULAR LENS PLACEMENT (IOC) LEFT;  Surgeon: Lockie Mola, MD;  Location: [facility];  Service: Ophthalmology;  Laterality: Left;   CATARACT EXTRACTION W/PHACO Right 05/05/2017   Procedure: CATARACT EXTRACTION PHACO AND INTRAOCULAR LENS PLACEMENT (IOC) RIGHT;  Surgeon: Lockie Mola, MD;  Location: [facility];  Service: Ophthalmology;  Laterality: Right;   COLONOSCOPY WITH PROPOFOL N/A 02/26/2020   Procedure: COLONOSCOPY WITH  PROPOFOL;  Surgeon: Regis Bill, MD;  Location: [facility] ENDOSCOPY;  Service: Endoscopy;  Laterality: N/A;   CRYOABLATION  2009   prostate   ESOPHAGOGASTRODUODENOSCOPY (EGD) WITH PROPOFOL N/A 02/26/2020   Procedure: ESOPHAGOGASTRODUODENOSCOPY (EGD) WITH PROPOFOL;  Surgeon: Regis Bill, MD;  Location: [facility] ENDOSCOPY;  Service: Endoscopy;  Laterality: N/A;   FOOT SURGERY  right   ganglion cyst removed   PROSTATE SURGERY     Family History  Problem Relation Age of Onset   Prostate cancer Brother        x 3   Breast cancer Sister    Kidney disease Neg Hx    Kidney cancer Neg Hx    Bladder Cancer Neg Hx    Social History   Socioeconomic History   Marital status: Single    Spouse name: Not on file   Number of children: Not on file   Years of education: Not on file   Highest education level: Not on file  Occupational History   Not on file  Tobacco Use   Smoking status: Never   Smokeless tobacco: Never  Vaping Use   Vaping status: Never Used  Substance and Sexual Activity   Alcohol use: No    Alcohol/week: 0.0 standard drinks of alcohol   Drug use: No   Sexual activity: Not Currently  Other Topics Concern   Not on file  Social History Narrative  Not on file   Social Determinants of Health   Financial Resource Strain: Not on file  Food Insecurity: Not on file  Transportation Needs: Not on file  Physical Activity: Not on file  Stress: Not on file  Social Connections: Not on file   Allergies  Allergen Reactions   Sulfa Antibiotics Other (See Comments)    Red skin    Medications   (Not in a hospital admission)    No current facility-administered medications for this encounter.  Current Outpatient Medications:    amoxicillin (AMOXIL) 500 MG capsule, Take 1 capsule by mouth 2 (two) times daily., Disp: , Rfl:    amoxicillin-clavulanate (AUGMENTIN) 875-125 MG tablet, Take 1 tablet by mouth 2 (two) times daily., Disp: , Rfl:    Cyanocobalamin 1000 MCG/ML  KIT, Inject 1,000 mcg into the muscle every 30 (thirty) days. , Disp: , Rfl:    levothyroxine (SYNTHROID) 75 MCG tablet, Take 75 mcg by mouth daily before breakfast., Disp: , Rfl:    ondansetron (ZOFRAN) 4 MG tablet, Take 1 tablet (4 mg total) by mouth every 8 (eight) hours as needed for vomiting or nausea., Disp: 20 tablet, Rfl: 0   ondansetron (ZOFRAN-ODT) 4 MG disintegrating tablet, Take 1 tablet (4 mg total) by mouth every 8 (eight) hours as needed for nausea or vomiting., Disp: 20 tablet, Rfl: 0  Vitals   Vitals:   02/16/23 1100 02/16/23 1109  BP: (!) 151/80   Pulse: 69   Resp: 18   Temp:  98.5 F (36.9 C)  TempSrc:  Oral  SpO2: 100%      There is no height or weight on file to calculate BMI.  Physical Exam   Physical Exam Gen: A&O x4, NAD HEENT: Atraumatic, normocephalic;mucous membranes moist; oropharynx clear, tongue without atrophy or fasciculations. Neck: Supple, trachea midline. Resp: CTAB, no w/r/r CV: RRR, no m/g/r; nml S1 and S2. 2+ symmetric peripheral pulses. Abd: soft/NT/ND; nabs x 4 quad Extrem: Nml bulk; no cyanosis, clubbing, or edema.  Neuro: *MS: A&O x4. Follows multi-step commands.  *Speech: fluid, nondysarthric, able to name and repeat *CN:    I: Deferred   II,III: PERRLA, VFF by confrontation, optic discs unable to be visualized 2/2 pupillary constriction   III,IV,VI: EOMI w/o nystagmus, no ptosis   V: Sensation intact from V1 to V3 to LT   VII: Eyelid closure was full.  Smile symmetric.   VIII: Hearing intact to voice   IX,X: Voice normal, palate elevates symmetrically    XI: SCM/trap 5/5 bilat   XII: Tongue protrudes midline, no atrophy or fasciculations   *Motor:   Normal bulk.  No tremor, rigidity or bradykinesia. No pronator drift.    Strength: Dlt Bic Tri WrE WrF FgS Gr HF KnF KnE PlF DoF    Left 5 5 5 5 5 5 5 5 5 5 5 5     Right 5 5 5 5 5 5 5 5 5 5 5 5     *Sensory: Intact to light touch, pinprick, temperature vibration throughout.  Symmetric. Propioception intact bilat.  No double-simultaneous extinction.  *Coordination:  Finger-to-nose, heel-to-shin, rapid alternating motions were intact. *Reflexes:  2+ and symmetric throughout without clonus; toes down-going bilat *Gait: deferred  NIHSS = 0  Premorbid mRS = 1   Labs   CBC:  Recent Labs  Lab 02/16/23 1035  WBC 6.1  NEUTROABS 4.2  HGB 11.1*  HCT 33.5*  MCV 85.7  PLT 221    Basic Metabolic Panel:  Lab Results  Component Value Date   NA 129 (L) 02/16/2023   K 4.1 02/16/2023   CO2 24 02/16/2023   GLUCOSE 124 (H) 02/16/2023   BUN 20 02/16/2023   CREATININE 0.99 02/16/2023   CALCIUM 8.6 (L) 02/16/2023   GFRNONAA >60 02/16/2023   GFRAA >60 01/28/2020   Lipid Panel: No results found for: "LDLCALC" HgbA1c: No results found for: "HGBA1C" Urine Drug Screen: No results found for: "LABOPIA", "COCAINSCRNUR", "LABBENZ", "AMPHETMU", "THCU", "LABBARB"  Alcohol Level     Component Value Date/Time   ETH <10 02/16/2023 1035    Head CT showed no e/o hemorrhage or acute infarct but did show a 6mm ovoid structure along the course of the L PCA which per radiology may represent a possible aneurysm or nidus of AVM (personally reviewed and discussed with neuroradiology by phone).   CTA is pending  Impression   This is a 89 yo man with pmhx COPD, hypothyroidism and prostate cancer who presented with 4 discrete episodes of word-finding difficulty today, currently asymptomatic, c/f crescendo TIA vs less likely seizure.  Head CT showed no e/o hemorrhage or acute infarct but did show a 6mm ovoid structure along the course of the L PCA which per radiology may represent a possible aneurysm or nidus of AVM (personally reviewed and discussed with neuroradiology by phone). Will obtain CTA for further evaluation.  Recommendations   - Admit for stroke workup - Permissive HTN x48 hrs from sx onset or until stroke ruled out by MRI goal BP <220/110. PRN labetalol or  hydralazine if BP above these parameters. Avoid oral antihypertensives. - MRI brain with and without contrast 2/2 hx malignancy and possibility of seizure - CTA H&N pending - TTE  - Check A1c and LDL + add statin per guidelines - ASA 81mg  daily + plavix 75mg  daily x21 days f/b ASA 81mg  daily monotherapy after that - q4 hr neuro checks - STAT head CT for any change in neuro exam - Tele - PT/OT/SLP - Stroke education - Amb referral to neurology upon discharge - rEEG - No indication to start AED at this time unless EEG shows epileptiform abnl  Will continue to follow  ______________________________________________________________________   Thank you for the opportunity to take part in the care of this patient. If you have any further questions, please contact the neurology consultation attending.  Signed,  Bing Neighbors, MD [facility] [phone]  If 7pm- 7am, please page neurology on call as listed in AMION.  **Any copied and pasted documentation in this note was written by me in another application not billed for and pasted by me into this document.  Addendum 1307 on 02/16/23  CTA H&N showed:  1. 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 I do not see a vessel egress from this aneurysm however. 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 Numerous abnormal draining veins from this area do raise the possibility that this could even be a venous aneurysm. The more distal PCA branches on the left are visible, but I do not see an intact PCA from the region of the aneurysm to the more distal PCA on the left. Therefore, the differential diagnosis for this includes arterial berry aneurysm, aneurysm associated with arteriovenous malformation and aneurysm associated with arteriovenous fistula. I think catheter angiography would be  necessary to fully understand this lesion. The advisability of that in this 89 year old patient  must be determined clinically. 2. Aortic atherosclerosis. 3. Calcified plaque in the right ICA bulb. 40% stenosis at the distal bulb. 4. No acute intracranial anterior circulation large vessel occlusion or proximal stenosis.

## 2023-02-16 NOTE — ED Triage Notes (Signed)
Pt comes via EMs from home with c/o unable to get words out. Pt was on phone with daughter who lives out of state. Pt had a episode of unable to get words out. EMS was called and arrived to home. EMS reports pt did have 2 episodes of unable to get words out. Pt is A*OX4. Pt ambulatory with steady gait.  VSS no thinners.  Pt speaking with staff in clear sentences. Mini stroke screen neg. Pt able to follow commands. Pt denies any numbness, dizziness, pain or blurry vision.

## 2023-02-16 NOTE — ED Provider Notes (Signed)
[facility] Provider Note    Event Date/Time   First MD Initiated Contact with Patient 02/16/23 1056     (approximate)   History   Possible stroke   HPI  DUANE EARNSHAW is a 89 y.o. male who presents with difficulty speaking.  Patient reports that started proximately 10:00 while he was speaking to his daughter, he states that he speaks with her every day on the telephone.  He found that he was having difficulty getting his words out although he knew it he wanted to say.  This seems to improve and then recur.  He notes this happened several times since then     Physical Exam   Triage Vital Signs: ED Triage Vitals  Encounter Vitals Group     BP 02/16/23 1100 (!) 151/80     Systolic BP Percentile --      Diastolic BP Percentile --      Pulse Rate 02/16/23 1100 69     Resp 02/16/23 1100 18     Temp --      Temp src --      SpO2 02/16/23 1100 100 %     Weight --      Height --      Head Circumference --      Peak Flow --      Pain Score 02/16/23 1032 0     Pain Loc --      Pain Education --      Exclude from Growth Chart --     Most recent vital signs: Vitals:   02/16/23 1100 02/16/23 1109  BP: (!) 151/80   Pulse: 69   Resp: 18   Temp:  98.5 F (36.9 C)  SpO2: 100%      General: Awake, no distress.  CV:  Good peripheral perfusion.  Resp:  Normal effort.  Abd:  No distention.  Other:  No extremity weakness, cranial nerves appear intact, clear difficulty with word finding suspicious for aphasia   ED Results / Procedures / Treatments   Labs (all labs ordered are listed, but only abnormal results are displayed) Labs Reviewed  CBC - Abnormal; Notable for the following components:      Result Value   RBC 3.91 (*)    Hemoglobin 11.1 (*)    HCT 33.5 (*)    All other components within normal limits  COMPREHENSIVE METABOLIC PANEL - Abnormal; Notable for the following components:   Sodium 129 (*)    Chloride 97 (*)    Glucose, Bld  124 (*)    Calcium 8.6 (*)    Albumin 3.2 (*)    All other components within normal limits  CBG MONITORING, ED - Abnormal; Notable for the following components:   Glucose-Capillary 114 (*)    All other components within normal limits  PROTIME-INR  APTT  DIFFERENTIAL  ETHANOL  I-STAT CREATININE, ED     EKG     RADIOLOGY CT head without acute abnormality    PROCEDURES:  Critical Care performed: yes  CRITICAL CARE Performed by: Jene Every   Total critical care time: 30 minutes  Critical care time was exclusive of separately billable procedures and treating other patients.  Critical care was necessary to treat or prevent imminent or life-threatening deterioration.  Critical care was time spent personally by me on the following activities: development of treatment plan with patient and/or surrogate as well as nursing, discussions with consultants, evaluation of patient's response to treatment, examination  of patient, obtaining history from patient or surrogate, ordering and performing treatments and interventions, ordering and review of laboratory studies, ordering and review of radiographic studies, pulse oximetry and re-evaluation of patient's condition.   Procedures   MEDICATIONS ORDERED IN ED: Medications  sodium chloride flush (NS) 0.9 % injection 3 mL (3 mLs Intravenous Given 02/16/23 1040)  iohexol (OMNIPAQUE) 350 MG/ML injection 75 mL (75 mLs Intravenous Contrast Given 02/16/23 1137)  gadobutrol (GADAVIST) 1 MMOL/ML injection 7.5 mL (7.5 mLs Intravenous Contrast Given 02/16/23 1344)     IMPRESSION / MDM / ASSESSMENT AND PLAN / ED COURSE  I reviewed the triage vital signs and the nursing notes. Patient's presentation is most consistent with acute presentation with potential threat to life or bodily function.  Patient presents with difficulty speaking as noted above.  Suspicious for CVA, he reports onset was approximately 10 AM.  He was within the window for  tPA, code stroke activated  Appears to be having intermittent symptoms  Work reviewed and overall reassuring, CT has been performed, no acute abnormality on my evaluation, pending radiology read.  Dr. Selina Cooley has consulted on the patient, has discussed with neuro IR and plan is for admission for stroke workup, outpatient follow-up for likely IR angiography, have discussed with hospitalist for admission      FINAL CLINICAL IMPRESSION(S) / ED DIAGNOSES   Final diagnoses:  Cerebrovascular accident (CVA), unspecified mechanism (HCC)     Rx / DC Orders   ED Discharge Orders     None        Note:  This document was prepared using Dragon voice recognition software and may include unintentional dictation errors.   Jene Every, MD 02/16/23 1357

## 2023-02-16 NOTE — Code Documentation (Signed)
Stroke Response Nurse Documentation Code Documentation  Christian Garcia is a 89 y.o. male arriving to [facility] via [facility] on 02/16/23 with past medical hx of hypothyroidism, prostate cancer, CPOD, peyronies disease, BPH,. On No antithrombotic. Code stroke was activated by ED.   Patient from home where he was LKW at 1000 and now complaining of multiple episodes of intermittent aphasia. Patient reports he woke up in his normal state of heath. He was speaking to his daughter on the phone and at 1000 patient was having difficulty getting words out, explaining that he knows what he wants to say, but can't get the words out. Patient activated by EDP with recurrence of symptoms.   Stroke team at the bedside after code stroke activated. Patient back to baseline. CT head completed before activation. Labs drawn and patient cleared for CT by Dr. Cyril Loosen. Patient to CT with team for CT angio head and neck. NIHSS 0 on eval, see documentation for details and code stroke times. The following imaging was completed:  CT Head and CTA. Patient is not a candidate for IV Thrombolytic due to symptoms resolved, per MD. Patient is not a candidate for IR due to no symptoms at this time.   Care Plan: every 2 hour NIHSS and vital signs, .   Bedside handoff with ED RN Ventura Bruns  Stroke Response RN

## 2023-02-16 NOTE — Progress Notes (Signed)
1110 Stroke cart activated and elert sent to [facility]. Pt was assessed by EDP Cyril Loosen, MD) prior to cart activation. Pt has completed and return to room from CT at this time. Pt presented to ED via EMS with c/o "difficulty getting words out" with LKW 1000. 1113 Dr. Selina Cooley with [facility] neuro paged. 1115 Dr. Selina Cooley at bedside assessing pt at this time. 1135 pt transported back to CT for advanced images.

## 2023-02-16 NOTE — Assessment & Plan Note (Addendum)
Patient presenting with sudden onset intermittent expressive aphasia.  Differential includes TIA versus CVA, intracranial structural anomaly.  Given history of recent night sweats with low-grade daytime fevers, will also evaluate for any infectious etiology.  Incidentally found small aneurysm is unlikely to be contributing to current presentation.  - Neurology consulted; appreciate their recommendations - MRI brain with and without contrast pending - Telemetry monitoring - Allow for permissive HTN (systolic < 220 and diastolic < 120) - Echocardiogram  - A1C and Lipid panel  - ASA 81 mg daily - Plavix 75 mg daily - Statin   - PT/OT/SLP

## 2023-02-17 ENCOUNTER — Inpatient Hospital Stay (HOSPITAL_COMMUNITY): Admit: 2023-02-17 | Payer: Medicare HMO

## 2023-02-17 ENCOUNTER — Observation Stay (HOSPITAL_BASED_OUTPATIENT_CLINIC_OR_DEPARTMENT_OTHER)
Admit: 2023-02-17 | Discharge: 2023-02-17 | Disposition: A | Discharge status: Home / Self Care | Payer: Medicare HMO | Attending: Internal Medicine | Admitting: Internal Medicine

## 2023-02-17 DIAGNOSIS — R4182 Altered mental status, unspecified: Secondary | ICD-10-CM

## 2023-02-17 DIAGNOSIS — G459 Transient cerebral ischemic attack, unspecified: Secondary | ICD-10-CM | POA: Diagnosis not present

## 2023-02-17 DIAGNOSIS — R4701 Aphasia: Secondary | ICD-10-CM | POA: Diagnosis not present

## 2023-02-17 DIAGNOSIS — I671 Cerebral aneurysm, nonruptured: Secondary | ICD-10-CM | POA: Diagnosis not present

## 2023-02-17 DIAGNOSIS — C61 Malignant neoplasm of prostate: Secondary | ICD-10-CM | POA: Diagnosis not present

## 2023-02-17 LAB — ECHOCARDIOGRAM COMPLETE
AR max vel: 2.7 cm2
AV Area VTI: 2.88 cm2
AV Area mean vel: 2.76 cm2
AV Mean grad: 3 mmHg
AV Peak grad: 5.3 mmHg
Ao pk vel: 1.15 m/s
Area-P 1/2: 2.57 cm2
MV VTI: 3.02 cm2
S' Lateral: 3 cm

## 2023-02-17 LAB — CBC WITH DIFFERENTIAL/PLATELET
Abs Immature Granulocytes: 0.02 10*3/uL (ref 0.00–0.07)
Basophils Absolute: 0 10*3/uL (ref 0.0–0.1)
Basophils Relative: 0 %
Eosinophils Absolute: 0.2 10*3/uL (ref 0.0–0.5)
Eosinophils Relative: 4 %
HCT: 30 % — ABNORMAL LOW (ref 39.0–52.0)
Hemoglobin: 10 g/dL — ABNORMAL LOW (ref 13.0–17.0)
Immature Granulocytes: 0 %
Lymphocytes Relative: 25 %
Lymphs Abs: 1.3 10*3/uL (ref 0.7–4.0)
MCH: 28.2 pg (ref 26.0–34.0)
MCHC: 33.3 g/dL (ref 30.0–36.0)
MCV: 84.5 fL (ref 80.0–100.0)
Monocytes Absolute: 0.7 10*3/uL (ref 0.1–1.0)
Monocytes Relative: 13 %
Neutro Abs: 3.1 10*3/uL (ref 1.7–7.7)
Neutrophils Relative %: 58 %
Platelets: 215 10*3/uL (ref 150–400)
RBC: 3.55 MIL/uL — ABNORMAL LOW (ref 4.22–5.81)
RDW: 12 % (ref 11.5–15.5)
WBC: 5.3 10*3/uL (ref 4.0–10.5)
nRBC: 0 % (ref 0.0–0.2)

## 2023-02-17 LAB — BASIC METABOLIC PANEL
Anion gap: 6 (ref 5–15)
BUN: 19 mg/dL (ref 8–23)
CO2: 24 mmol/L (ref 22–32)
Calcium: 8.3 mg/dL — ABNORMAL LOW (ref 8.9–10.3)
Chloride: 100 mmol/L (ref 98–111)
Creatinine, Ser: 0.96 mg/dL (ref 0.61–1.24)
GFR, Estimated: >60 mL/min (ref >60)
Glucose, Bld: 105 mg/dL — ABNORMAL HIGH (ref 70–99)
Potassium: 4.1 mmol/L (ref 3.5–5.1)
Sodium: 130 mmol/L — ABNORMAL LOW (ref 135–145)

## 2023-02-17 MED ORDER — CLOPIDOGREL BISULFATE 75 MG PO TABS: 75.0000 mg | ORAL_TABLET | Freq: Every day | ORAL | 0 refills | Status: DC

## 2023-02-17 MED ORDER — ATORVASTATIN CALCIUM 40 MG PO TABS: 40.0000 mg | ORAL_TABLET | Freq: Every day | ORAL | 0 refills | Status: AC

## 2023-02-17 MED ORDER — ADULT MULTIVITAMIN W/MINERALS CH: 1.0000 | ORAL_TABLET | Freq: Every day | ORAL | Status: DC

## 2023-02-17 MED ORDER — ASPIRIN 81 MG PO TBEC: 81.0000 mg | DELAYED_RELEASE_TABLET | Freq: Every day | ORAL | Status: AC

## 2023-02-17 NOTE — Evaluation (Signed)
Occupational Therapy Evaluation Patient Details Name: Christian Garcia MRN: [id number] DOB: [birth year] Today's Date: 02/17/2023   History of Present Illness Christian Garcia is a 89 y.o. male with medical history significant of prediabetes, stage IIIa CKD, pernicious anemia, hypothyroidism, hyperlipidemia, COPD, BPH, prostate cancer, who presents to the ED due to difficulty finding words on 02/16/23. MRI neg for acute findings   Clinical Impression   Pt received with SLP in room. Patient lives alone, drives, IND in ADL/IADL performance PTA. No falls hx. Patient currently functioning at baseline for mobility, no focal neurological deficits noted. Functional mobility t/f bathroom MOD I (increased time), pt stepping forwards/backwards/laterally with no LOB, balances against resistance with eyes closed using wide BOS. Short Blessed Test WNL. Discussed with pt importance of continued mobility for overall quality of life, recommended pt do a outpatient PT evaluation to maintain CLOF as pt does endorse some use of furniture for balance PRN. GMC/FMC WFL. Pt dual tasks while mobilizing MOD I, simulates ADL performance UB/LB WFL. OT provides edu re: falls prevention and safety in bathroom with increased DME (grab bars and TTB) to maximize IND. Pt appears to be at baseline, no acute OT needs identified. OT will complete orders and sign off.      Recommendations for follow up therapy are one component of a multi-disciplinary discharge planning process, led by the attending physician.  Recommendations may be updated based on patient status, additional functional criteria and insurance authorization.   Assistance Recommended at Discharge PRN  Patient can return home with the following Direct supervision/assist for medications management;Direct supervision/assist for financial management    Functional Status Assessment  Patient has not had a recent decline in their functional status  Equipment Recommendations   Tub/shower bench;BSC/3in1       Precautions / Restrictions Precautions Precautions: Fall Restrictions Weight Bearing Restrictions: No      Mobility Bed Mobility Overal bed mobility: Modified Independent                  Transfers Overall transfer level: Modified independent Equipment used: None                      Balance Overall balance assessment: Modified Independent (Pt baseline; increased time for safety)                               Standardized Balance Assessment Standardized Balance Assessment :  (Pt able to walk forwards/backwards/laterally/static balance eyes closed no LOB with wide BOS)         ADL either performed or assessed with clinical judgement   ADL Overall ADL's : At baseline                                       General ADL Comments: Functional mobility t/f bathroom no AD, no LOB, pt baseline     Vision Baseline Vision/History: 1 Wears glasses Ability to See in Adequate Light: 0 Adequate              Pertinent Vitals/Pain Pain Assessment Pain Assessment: No/denies pain        Extremity/Trunk Assessment Upper Extremity Assessment Upper Extremity Assessment: Overall WFL for tasks assessed   Lower Extremity Assessment Lower Extremity Assessment: Overall WFL for tasks assessed       Communication Communication Communication: [facility]   Cognition  Arousal/Alertness: Awake/alert Behavior During Therapy: WFL for tasks assessed/performed Overall Cognitive Status: Within Functional Limits for tasks assessed                                 General Comments: Short Blessed Test performed WNL                Home Living Family/patient expects to be discharged to:: Private residence Living Arrangements: Alone Available Help at Discharge: Available PRN/intermittently (Daughter lives out of state) Type of Home: House Home Access: Stairs to enter Secretary/administrator of Steps:  1         Bathroom Shower/Tub: Chief Strategy Officer: Standard         Additional Comments: Engineer, maintenance (IT) bars, has hand held shower head      Prior Functioning/Environment Prior Level of Function : Independent/Modified Independent;Driving             Mobility Comments: IND, driving, no falls hx, no AD ADLs Comments: IND, no AD         AM-PAC OT "6 Clicks" Daily Activity     Outcome Measure Help from another person eating meals?: None Help from another person taking care of personal grooming?: None Help from another person toileting, which includes using toliet, bedpan, or urinal?: None Help from another person bathing (including washing, rinsing, drying)?: None Help from another person to put on and taking off regular upper body clothing?: None Help from another person to put on and taking off regular lower body clothing?: None 6 Click Score: 24   End of Session Nurse Communication: Mobility status  Activity Tolerance: Patient tolerated treatment well Patient left: in bed;with nursing/sitter in room;with call bell/phone within reach  OT Visit Diagnosis: Other symptoms and signs involving the nervous system (Z61.096)                Time: 0454-0981 OT Time Calculation (min): 32 min Charges:  OT General Charges $OT Visit: 1 Visit OT Treatments $Self Care/Home Management : 8-22 mins  Hermione Havlicek L. Lexis Potenza, OTR/L  02/17/23, 11:24 AM

## 2023-02-17 NOTE — Progress Notes (Signed)
*  PRELIMINARY RESULTS* Echocardiogram 2D Echocardiogram has been performed.  Christian Garcia 02/17/2023, 7:56 AM

## 2023-02-17 NOTE — Plan of Care (Signed)
  Problem: Education: Goal: Knowledge of disease or condition will improve Outcome: Not Progressing Goal: Knowledge of secondary prevention will improve (MUST DOCUMENT ALL) Outcome: Not Progressing Goal: Knowledge of patient specific risk factors will improve (Mark N/A or DELETE if not current risk factor) Outcome: Not Progressing   Problem: Ischemic Stroke/TIA Tissue Perfusion: Goal: Complications of ischemic stroke/TIA will be minimized Outcome: Not Progressing   Problem: Coping: Goal: Will verbalize positive feelings about self Outcome: Not Progressing Goal: Will identify appropriate support needs Outcome: Not Progressing   Problem: Health Behavior/Discharge Planning: Goal: Ability to manage health-related needs will improve Outcome: Not Progressing Goal: Goals will be collaboratively established with patient/family Outcome: Not Progressing   Problem: Self-Care: Goal: Ability to participate in self-care as condition permits will improve Outcome: Not Progressing Goal: Verbalization of feelings and concerns over difficulty with self-care will improve Outcome: Not Progressing Goal: Ability to communicate needs accurately will improve Outcome: Not Progressing   Problem: Nutrition: Goal: Risk of aspiration will decrease Outcome: Not Progressing Goal: Dietary intake will improve Outcome: Not Progressing   Problem: Education: Goal: Knowledge of General Education information will improve Description: Including pain rating scale, medication(s)/side effects and non-pharmacologic comfort measures Outcome: Not Progressing   Problem: Health Behavior/Discharge Planning: Goal: Ability to manage health-related needs will improve Outcome: Not Progressing   Problem: Clinical Measurements: Goal: Ability to maintain clinical measurements within normal limits will improve Outcome: Not Progressing Goal: Will remain free from infection Outcome: Not Progressing Goal: Diagnostic test  results will improve Outcome: Not Progressing Goal: Respiratory complications will improve Outcome: Not Progressing Goal: Cardiovascular complication will be avoided Outcome: Not Progressing   Problem: Activity: Goal: Risk for activity intolerance will decrease Outcome: Not Progressing   Problem: Nutrition: Goal: Adequate nutrition will be maintained Outcome: Not Progressing   Problem: Coping: Goal: Level of anxiety will decrease Outcome: Not Progressing   Problem: Elimination: Goal: Will not experience complications related to bowel motility Outcome: Not Progressing Goal: Will not experience complications related to urinary retention Outcome: Not Progressing   Problem: Pain Managment: Goal: General experience of comfort will improve Outcome: Not Progressing   Problem: Safety: Goal: Ability to remain free from injury will improve Outcome: Not Progressing   Problem: Skin Integrity: Goal: Risk for impaired skin integrity will decrease Outcome: Not Progressing   

## 2023-02-17 NOTE — Progress Notes (Signed)
PT Cancellation Note  Patient Details Name: Christian Garcia MRN: [id number] DOB: 11-20-1928   Cancelled Treatment:    Reason Eval/Treat Not Completed: PT screened, no needs identified, will sign off (Spoke with patient at bedside who reports he is at his baseline level of functional independence. Confirmed with OT that patient is Modified independent with activity. No apparent acute PT needs at this time.)  Donna Bernard, PT, MPT  Ina Homes 02/17/2023, 1:10 PM

## 2023-02-17 NOTE — Progress Notes (Signed)
SLP Cancellation Note  Patient Details Name: Christian Garcia MRN: [id number] DOB: 1929-03-05   Cancelled treatment:       Reason Eval/Treat Not Completed: SLP screened, no needs identified, will sign off (chart reviewed; consulted NSG and met w/ pt in room.)  Pt denied any difficulty swallowing and is currently on a regular diet; tolerates swallowing pills w/ water per NSG. Pt stated he had ordered breakfast this morning "when they called".  Pt conversed in conversation w/out gross expressive/receptive deficits noted; pt denied any speech-language deficits today; an inconsistent hesitation was noted at times. Pt described that he had difficulty w/ his speech yesterday prompting a friend to call EMS. Speech intelligible, clear. Noted Neurology note indicating NIHSS of 0 w/ "no aphasia. Head CT showed no hemorrhage or acute infarct but did show a 6mm ovoid structure along the course of the L PCA"; -- MRI: "No acute or reversible finding. No brain mass or focal cortical finding to  correlate with seizure history. 2. 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 No further skilled ST services indicated as pt appears at his baseline. Pt could f/u w/ PCP if any further concerns. Pt agreed. NSG to reconsult if any change in status while admitted.     Jerilynn Som, MS, CCC-SLP Speech Language Pathologist Rehab Services; [facility] [phone] (ascom) Kofi Murrell 02/17/2023, 2:08 PM

## 2023-02-17 NOTE — Plan of Care (Signed)
Problem: Education: Goal: Knowledge of disease or condition will improve 02/17/2023 1636 by Kandace Parkins, RN Outcome: Progressing 02/17/2023 0740 by Kandace Parkins, RN Outcome: Not Progressing Goal: Knowledge of secondary prevention will improve (MUST DOCUMENT ALL) 02/17/2023 1636 by Kandace Parkins, RN Outcome: Progressing 02/17/2023 0740 by Kandace Parkins, RN Outcome: Not Progressing Goal: Knowledge of patient specific risk factors will improve Christian Garcia N/A or DELETE if not current risk factor) 02/17/2023 1636 by Kandace Parkins, RN Outcome: Progressing 02/17/2023 0740 by Kandace Parkins, RN Outcome: Not Progressing   Problem: Ischemic Stroke/TIA Tissue Perfusion: Goal: Complications of ischemic stroke/TIA will be minimized 02/17/2023 1636 by Kandace Parkins, RN Outcome: Progressing 02/17/2023 0740 by Kandace Parkins, RN Outcome: Not Progressing   Problem: Coping: Goal: Will verbalize positive feelings about self 02/17/2023 1636 by Kandace Parkins, RN Outcome: Progressing 02/17/2023 0740 by Kandace Parkins, RN Outcome: Not Progressing Goal: Will identify appropriate support needs 02/17/2023 1636 by Kandace Parkins, RN Outcome: Progressing 02/17/2023 0740 by Kandace Parkins, RN Outcome: Not Progressing   Problem: Health Behavior/Discharge Planning: Goal: Ability to manage health-related needs will improve 02/17/2023 1636 by Kandace Parkins, RN Outcome: Progressing 02/17/2023 0740 by Kandace Parkins, RN Outcome: Not Progressing Goal: Goals will be collaboratively established with patient/family 02/17/2023 1636 by Kandace Parkins, RN Outcome: Progressing 02/17/2023 0740 by Kandace Parkins, RN Outcome: Not Progressing   Problem: Self-Care: Goal: Ability to participate in self-care as condition permits will improve 02/17/2023  1636 by Kandace Parkins, RN Outcome: Progressing 02/17/2023 0740 by Kandace Parkins, RN Outcome: Not Progressing Goal: Verbalization of feelings and concerns over difficulty with self-care will improve 02/17/2023 1636 by Kandace Parkins, RN Outcome: Progressing 02/17/2023 0740 by Kandace Parkins, RN Outcome: Not Progressing Goal: Ability to communicate needs accurately will improve 02/17/2023 1636 by Kandace Parkins, RN Outcome: Progressing 02/17/2023 0740 by Kandace Parkins, RN Outcome: Not Progressing   Problem: Nutrition: Goal: Risk of aspiration will decrease 02/17/2023 1636 by Kandace Parkins, RN Outcome: Progressing 02/17/2023 0740 by Kandace Parkins, RN Outcome: Not Progressing Goal: Dietary intake will improve 02/17/2023 1636 by Kandace Parkins, RN Outcome: Progressing 02/17/2023 0740 by Kandace Parkins, RN Outcome: Not Progressing   Problem: Education: Goal: Knowledge of General Education information will improve Description: Including pain rating scale, medication(s)/side effects and non-pharmacologic comfort measures 02/17/2023 1636 by Kandace Parkins, RN Outcome: Progressing 02/17/2023 0740 by Kandace Parkins, RN Outcome: Not Progressing   Problem: Health Behavior/Discharge Planning: Goal: Ability to manage health-related needs will improve 02/17/2023 1636 by Kandace Parkins, RN Outcome: Progressing 02/17/2023 0740 by Kandace Parkins, RN Outcome: Not Progressing   Problem: Clinical Measurements: Goal: Ability to maintain clinical measurements within normal limits will improve 02/17/2023 1636 by Kandace Parkins, RN Outcome: Progressing 02/17/2023 0740 by Kandace Parkins, RN Outcome: Not Progressing Goal: Will remain free from infection 02/17/2023 1636 by Kandace Parkins, RN Outcome: Progressing 02/17/2023  0740 by Kandace Parkins, RN Outcome: Not Progressing Goal: Diagnostic test results will improve 02/17/2023 1636 by Kandace Parkins, RN Outcome: Progressing 02/17/2023 0740 by Kandace Parkins, RN Outcome: Not Progressing Goal: Respiratory complications will improve 02/17/2023 1636 by Kandace Parkins, RN Outcome: Progressing 02/17/2023 0740 by Kandace Parkins, RN Outcome: Not Progressing Goal: Cardiovascular complication will be avoided 02/17/2023 1636 by Kandace Parkins, RN Outcome: Progressing 02/17/2023 0740 by Kandace Parkins, RN Outcome: Not Progressing  Problem: Activity: Goal: Risk for activity intolerance will decrease 02/17/2023 1636 by Kandace Parkins, RN Outcome: Progressing 02/17/2023 0740 by Kandace Parkins, RN Outcome: Not Progressing   Problem: Nutrition: Goal: Adequate nutrition will be maintained 02/17/2023 1636 by Kandace Parkins, RN Outcome: Progressing 02/17/2023 0740 by Kandace Parkins, RN Outcome: Not Progressing   Problem: Coping: Goal: Level of anxiety will decrease 02/17/2023 1636 by Kandace Parkins, RN Outcome: Progressing 02/17/2023 0740 by Kandace Parkins, RN Outcome: Not Progressing   Problem: Elimination: Goal: Will not experience complications related to bowel motility 02/17/2023 1636 by Kandace Parkins, RN Outcome: Progressing 02/17/2023 0740 by Kandace Parkins, RN Outcome: Not Progressing Goal: Will not experience complications related to urinary retention 02/17/2023 1636 by Kandace Parkins, RN Outcome: Progressing 02/17/2023 0740 by Kandace Parkins, RN Outcome: Not Progressing   Problem: Pain Managment: Goal: General experience of comfort will improve 02/17/2023 1636 by Kandace Parkins, RN Outcome: Progressing 02/17/2023 0740 by Kandace Parkins, RN Outcome:  Not Progressing   Problem: Safety: Goal: Ability to remain free from injury will improve 02/17/2023 1636 by Kandace Parkins, RN Outcome: Progressing 02/17/2023 0740 by Kandace Parkins, RN Outcome: Not Progressing   Problem: Skin Integrity: Goal: Risk for impaired skin integrity will decrease 02/17/2023 1636 by Kandace Parkins, RN Outcome: Progressing 02/17/2023 0740 by Kandace Parkins, RN Outcome: Not Progressing

## 2023-02-17 NOTE — Discharge Summary (Addendum)
Physician Discharge Summary   Patient: Christian Garcia MRN: [id number] DOB: [birth year]  Admit date:     02/16/2023  Discharge date: 02/17/23  Discharge Physician: Lurene Shadow   PCP: Lauro Regulus, MD   Recommendations at discharge:   Follow-up with PCP in 1 week Outpatient follow-up with neurointerventional list (to be arranged by neurologist, Dr. Selina Cooley)  Discharge Diagnoses: Principal Problem:   Expressive aphasia Active Problems:   Brain aneurysm   Essential hypertension   Prediabetes   Stage 3a chronic kidney disease (HCC)   Acquired hypothyroidism   Prostate cancer (HCC)   TIA (transient ischemic attack)  Resolved Problems:   * No resolved hospital problems. [facility] Course:  [name] is a 89 y.o. male with medical history significant of prediabetes, stage IIIa CKD, pernicious anemia, hypothyroidism, hyperlipidemia, COPD, BPH, prostate cancer, who presented to the ED due to difficulty finding words.  He noticed this while he was speaking on the phone with his daughter.  His speech was garbled.  Abnormal speech was short-lived but it recurred.  She also reports-night sweats and low-grade fevers about a week prior to admission.  According to his daughter, he was initially prescribed Augmentin on 02/11/2023 but this was too big for him to swallow.  Prescription was changed to amoxicillin on 02/12/2023.      Assessment and Plan: * Expressive aphasia TIA suspected.  MRI brain did not show any evidence of acute stroke.  Neurologist recommended dual antiplatelet therapy with low-dose aspirin and Plavix for a total of 21 days followed by low-dose aspirin monotherapy. He will also be started on Lipitor 40 mg daily. 2D echo showed EF estimated at 55 to 60%, grade 1 diastolic dysfunction, mild MR.   Brain aneurysm CTA and MRI brain showed was a 5.5 mm aneurysm likely arising from the left P1 P2 junction with possible developmental venous anomaly.  Neurology  discussed with neuro IR, recommending outpatient evaluation with possible angiography at that time.  No urgent need for intervention at this time   Dyslipidemia LDL 113, HDL 27.  Start Lipitor 40 mg daily.   Essential hypertension No antihypertensives for now.  Outpatient follow-up with PCP for further management.   Prediabetes Hemoglobin A1c 6.3.   No indication for hypoglycemic agents at this time.  Outpatient follow-up with PCP.   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  Active surveillance since with normal PSA.   Case was discussed with Dr. Selina Cooley, neurologist.  She will arrange outpatient follow-up with neurointerventionalist in 2 weeks for evaluation of brain aneurysm  Patient's condition is improved on he is deemed stable for discharge to home today.  Discharge plan was discussed with Ms. Phebe Colla, daughter, over the phone.  Discharge plan discussed with Ms. Phebe Colla, daughter, over the phone.  Discussed risks and benefits of new prescriptions (aspirin, Plavix and Lipitor).  All her questions were answered.        Consultants: Neurologist Procedures performed: None Disposition: Home Diet recommendation:  Discharge Diet Orders (From admission, onward)     Start     Ordered   02/17/23 0000  Diet - low sodium heart healthy        02/17/23 1708           Cardiac diet DISCHARGE MEDICATION: Allergies as of 02/17/2023  Reactions   Sulfa Antibiotics Other (See Comments)   Red skin        Medication List     STOP taking these medications    amoxicillin 500 MG capsule Commonly known as: AMOXIL   amoxicillin-clavulanate 875-125 MG tablet Commonly known as: AUGMENTIN   ondansetron 4 MG disintegrating tablet Commonly known as: ZOFRAN-ODT       TAKE these  medications    aspirin EC 81 MG tablet Take 1 tablet (81 mg total) by mouth daily. Swallow whole. Start taking on: February 18, 2023   atorvastatin 40 MG tablet Commonly known as: LIPITOR Take 1 tablet (40 mg total) by mouth daily. Start taking on: February 18, 2023   clopidogrel 75 MG tablet Commonly known as: PLAVIX Take 1 tablet (75 mg total) by mouth daily for 20 days. Start taking on: February 18, 2023   Cyanocobalamin 1000 MCG/ML Kit Inject 1,000 mcg into the muscle every 30 (thirty) days.   levothyroxine 75 MCG tablet Commonly known as: SYNTHROID Take 75 mcg by mouth daily before breakfast.   ondansetron 4 MG tablet Commonly known as: Zofran Take 1 tablet (4 mg total) by mouth every 8 (eight) hours as needed for vomiting or nausea.        Discharge Exam:  Blood pressure (!) 155/68, pulse 67, temperature 97.6 F (36.4 C), temperature source Oral, resp. rate 19, SpO2 97%.   GEN: NAD SKIN: Warm and dry EYES: EOMI ENT: MMM CV: RRR PULM: CTA B ABD: soft, ND, NT, +BS CNS: AAO x 3, non focal EXT: No edema or tenderness   Condition at discharge: good  The results of significant diagnostics from this hospitalization (including imaging, microbiology, ancillary and laboratory) are listed below for reference.   Imaging Studies: ECHOCARDIOGRAM COMPLETE  Result Date: 02/17/2023    ECHOCARDIOGRAM REPORT   Patient Name:   Christian Garcia Date of Exam: 02/17/2023 Medical Rec #:  [id number]      Height:       70.0 in Accession #:    [id number]     Weight:       180.0 lb Date of Birth:  Sep 30, 1928      BSA:          1.996 m Patient Age:    89 years       BP:           129/58 mmHg Patient Gender: M              HR:           58 bpm. Exam Location:  [facility] Procedure: 2D Echo, Cardiac Doppler and Color Doppler Indications:    Stroke I63.9  History:        Patient has no prior history of Echocardiogram examinations.                 COPD.  Sonographer:    Cristela Blue Referring Phys: [id number] Verdene Lennert Diagnosing      Debbe Odea MD Phys:  Sonographer Comments: Technically challenging study due to limited acoustic windows, no parasternal window and no subcostal window. Image acquisition challenging due to COPD. IMPRESSIONS  1. Left ventricular ejection fraction, by estimation, is 55 to 60%. The left ventricle has normal function. The left ventricle has no regional wall motion abnormalities. Left ventricular diastolic parameters are consistent with Grade I diastolic dysfunction (impaired relaxation).  2. Right ventricular systolic function is normal. The right ventricular size is normal.  3. The mitral  valve is normal in structure. Mild mitral valve regurgitation.  4. The aortic valve was not well visualized. Aortic valve regurgitation is mild. FINDINGS  Left Ventricle: Left ventricular ejection fraction, by estimation, is 55 to 60%. The left ventricle has normal function. The left ventricle has no regional wall motion abnormalities. The left ventricular internal cavity size was normal in size. There is  no left ventricular hypertrophy. Left ventricular diastolic parameters are consistent with Grade I diastolic dysfunction (impaired relaxation). Right Ventricle: The right ventricular size is normal. No increase in right ventricular wall thickness. Right ventricular systolic function is normal. Left Atrium: Left atrial size was normal in size. Right Atrium: Right atrial size was normal in size. Pericardium: There is no evidence of pericardial effusion. Mitral Valve: The mitral valve is normal in structure. Mild mitral valve regurgitation. MV peak gradient, 3.1 mmHg. The mean mitral valve gradient is 1.0 mmHg. Tricuspid Valve: The tricuspid valve is not well visualized. Tricuspid valve regurgitation is not demonstrated. Aortic Valve: The aortic valve was not well visualized. Aortic valve regurgitation is mild. Aortic valve mean gradient measures 3.0 mmHg. Aortic valve peak gradient measures 5.3 mmHg.  Aortic valve area, by VTI measures 2.88 cm. Pulmonic Valve: The pulmonic valve was not well visualized. Pulmonic valve regurgitation is not visualized. Aorta: The aortic root is normal in size and structure. Venous: The inferior vena cava was not well visualized. IAS/Shunts: No atrial level shunt detected by color flow Doppler.  LEFT VENTRICLE PLAX 2D LVIDd:         4.40 cm   Diastology LVIDs:         3.00 cm   LV e' medial:    8.70 cm/s LV PW:         1.20 cm   LV E/e' medial:  7.0 LV IVS:        1.10 cm   LV e' lateral:   13.70 cm/s LVOT diam:     2.00 cm   LV E/e' lateral: 4.4 LV SV:         70 LV SV Index:   35 LVOT Area:     3.14 cm  RIGHT VENTRICLE RV Basal diam:  3.10 cm RV Mid diam:    2.60 cm LEFT ATRIUM           Index        RIGHT ATRIUM           Index LA diam:      2.80 cm 1.40 cm/m   RA Area:     15.60 cm LA Vol (A2C): 29.6 ml 14.83 ml/m  RA Volume:   37.10 ml  18.59 ml/m LA Vol (A4C): 37.6 ml 18.84 ml/m  AORTIC VALVE AV Area (Vmax):    2.70 cm AV Area (Vmean):   2.76 cm AV Area (VTI):     2.88 cm AV Vmax:           115.00 cm/s AV Vmean:          80.800 cm/s AV VTI:            0.243 m AV Peak Grad:      5.3 mmHg AV Mean Grad:      3.0 mmHg LVOT Vmax:         99.00 cm/s LVOT Vmean:        71.100 cm/s LVOT VTI:          0.223 m LVOT/AV VTI ratio: 0.92  AORTA Ao Root diam: 3.40 cm  MITRAL VALVE               TRICUSPID VALVE MV Area (PHT): 2.57 cm    TR Peak grad:   15.5 mmHg MV Area VTI:   3.02 cm    TR Vmax:        197.00 cm/s MV Peak grad:  3.1 mmHg MV Mean grad:  1.0 mmHg    SHUNTS MV Vmax:       0.88 m/s    Systemic VTI:  0.22 m MV Vmean:      53.9 cm/s   Systemic Diam: 2.00 cm MV Decel Time: 295 msec MV E velocity: 60.80 cm/s MV A velocity: 80.70 cm/s MV E/A ratio:  0.75 Debbe Odea MD Electronically signed by Debbe Odea MD Signature Date/Time: 02/17/2023/12:44:51 PM    Final    MR BRAIN W WO CONTRAST  Result Date: 02/16/2023 CLINICAL DATA:  Seizure, new onset with no  history of trauma. Acute stroke suspected. Intermittent aphasia. EXAM: MRI HEAD WITHOUT AND WITH CONTRAST TECHNIQUE: Multiplanar, multiecho pulse sequences of the brain and surrounding structures were obtained without and with intravenous contrast. CONTRAST:  7.76mL GADAVIST GADOBUTROL 1 MMOL/ML IV SOLN COMPARISON:  Head CT and CTA from earlier today. FINDINGS: Brain: No acute infarction, hemorrhage, hydrocephalus, extra-axial collection or mass lesion. Generalized cortical atrophy that is mild for age. No focal encephalomalacia. Unremarkable appearance of the medial temporal lobes. Mineralization at the left internal capsule and adjacent deep gray nuclei reflecting a developmental venous anomaly by prior CTA. No superimposed edema or discrete hemorrhage. Vascular: Developmental venous anomaly is noted above. Enhancing rounded structure ventral to the left cerebral peduncle measuring 5 mm, reference recent CTA description. Skull and upper cervical spine: Hypointense appearance at the left C1 lateral mass is attributed to degenerative sclerosis when correlated with CT. C2-3 intervertebral ankylosis. No evidence of aggressive bone lesion. Sinuses/Orbits: No acute finding IMPRESSION: 1. No acute or reversible finding. No brain mass or focal cortical finding to correlate with seizure history. 2. 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 Electronically Signed   By: Tiburcio Pea M.D.   On: 02/16/2023 14:06   CT ANGIO HEAD NECK W WO CM (CODE STROKE)  Result Date: 02/16/2023 CLINICAL DATA:  Transient ischemic attack.  Speech disturbance. EXAM: CT ANGIOGRAPHY HEAD AND NECK WITH AND WITHOUT CONTRAST TECHNIQUE: Multidetector CT imaging of the head and neck was performed using the standard protocol during bolus administration of intravenous contrast. Multiplanar CT image reconstructions and MIPs were obtained to evaluate the vascular anatomy. Carotid stenosis  measurements (when applicable) are obtained utilizing NASCET criteria, using the distal internal carotid diameter as the denominator. RADIATION DOSE REDUCTION: This exam was performed according to the departmental dose-optimization program which includes automated exposure control, adjustment of the mA and/or kV according to patient size and/or use of iterative reconstruction technique. CONTRAST:  75mL OMNIPAQUE IOHEXOL 350 MG/ML SOLN COMPARISON:  Head CT earlier same day. FINDINGS: CTA NECK FINDINGS Aortic arch: Aortic atherosclerosis. Branching pattern is normal without origin stenosis. Right carotid system: Common carotid artery is tortuous but widely patent to the bifurcation. Calcified plaque in the ICA bulb. Minimal diameter at the distal bulb is 3.9 mm. Compared to a more distal cervical ICA diameter of 6.4 mm, this indicates a 40% stenosis. Left carotid system: Common carotid artery is tortuous but widely patent to the bifurcation. Minimal plaque at the bifurcation but no stenosis. Vertebral arteries: Both vertebral artery origins widely patent.  Both vertebral arteries appear normal through the cervical region to the foramen magnum. Skeleton: Chronic fusion at C2 and C3. Ordinary mid cervical spondylosis. Other neck: No mass or lymphadenopathy. Upper chest: Lung apices are clear. Review of the MIP images confirms the above findings CTA HEAD FINDINGS Anterior circulation: Both internal carotid arteries are patent through the skull base and siphon regions. No siphon stenosis. The anterior and middle cerebral vessels are patent. No large vessel occlusion or proximal stenosis. No aneurysm or vascular malformation. Posterior circulation: Both vertebral arteries are widely patent through the foramen magnum to the basilar artery. No basilar stenosis. Posterior circulation branch vessels are patent. There is a 5.5 mm aneurysm that I think probably arises from the left P1 P2 junction. I do not see a vessel egress  from this aneurysm however. 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 Numerous abnormal draining veins from this area do raise the possibility that this could even be a venous aneurysm. The more distal PCA branches on the left are visible, but I do not see an intact PCA from the region of the aneurysm to the more distal PCA on the left. Therefore, the differential diagnosis for this includes arterial berry aneurysm, aneurysm associated with arteriovenous malformation and aneurysm associated with arteriovenous fistula. I think you would take a catheter angiogram to fully understand this lesion. The advisability of that in this 89 year old patient must be determined clinically. Venous sinuses: Patent and normal Anatomic variants: None other significant. Review of the MIP images confirms the above findings IMPRESSION: 1. 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 I do not see a vessel egress from this aneurysm however. 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 Numerous abnormal draining veins from this area do raise the possibility that this could even be a venous aneurysm. The more distal PCA branches on the left are visible, but I do not see an intact PCA from the region of the aneurysm to the more distal PCA on the left. Therefore, the differential diagnosis for this includes arterial berry aneurysm, aneurysm associated with arteriovenous malformation and aneurysm associated with arteriovenous fistula. I think catheter angiography would be necessary to fully understand this lesion. The advisability of that in this 89 year old patient must be determined clinically. 2. Aortic atherosclerosis. 3. Calcified plaque in the right ICA bulb. 40% stenosis at the distal bulb. 4.  No acute intracranial anterior circulation large vessel occlusion or proximal stenosis. Aortic Atherosclerosis (ICD10-I70.0). Electronically Signed   By: Paulina Fusi M.D.   On: 02/16/2023 12:12   CT HEAD WO CONTRAST  Result Date: 02/16/2023 CLINICAL DATA:  rule out stroke. EXAM: CT HEAD WITHOUT CONTRAST TECHNIQUE: Contiguous axial images were obtained from the base of the skull through the vertex without intravenous contrast. RADIATION DOSE REDUCTION: This exam was performed according to the departmental dose-optimization program which includes automated exposure control, adjustment of the mA and/or kV according to patient size and/or use of iterative reconstruction technique. COMPARISON:  None Available. FINDINGS: Brain: No acute intracranial hemorrhage. Asymmetric mineralization of the left basal ganglia and thalamus. Gray-white differentiation is preserved. No hydrocephalus or extra-axial collection. No mass effect or midline shift. Vascular: 6 mm ovoid structure along the course of the left PCA, possible aneurysm or nidus of arteriovenous malformation. Skull: No  calvarial fracture or suspicious bone lesion. Skull base is unremarkable. Sinuses/Orbits: Unremarkable. Other: None. IMPRESSION: 1. No acute intracranial hemorrhage or evidence of acute infarction. 2. 6 mm ovoid structure along the course of the left PCA, possible aneurysm or nidus of arteriovenous malformation. CTA head is recommended for further evaluation. Code stroke imaging results were communicated on 02/16/2023 at 11:28 am to provider Dr. Selina Cooley via telephone, who verbally acknowledged these results. Electronically Signed   By: Orvan Falconer M.D.   On: 02/16/2023 11:30    Microbiology: Results for orders placed or performed during the hospital encounter of 02/22/20  SARS CORONAVIRUS 2 (TAT 6-24 HRS) Nasopharyngeal Nasopharyngeal Swab     Status: None   Collection Time: 02/22/20  9:54 AM   Specimen: Nasopharyngeal Swab  Result Value Ref  Range Status   SARS Coronavirus 2 NEGATIVE NEGATIVE Final    Comment: (NOTE) SARS-CoV-2 target nucleic acids are NOT DETECTED.  The SARS-CoV-2 RNA is generally detectable in upper and lower respiratory specimens during the acute phase of infection. Negative results do not preclude SARS-CoV-2 infection, do not rule out co-infections with other pathogens, and should not be used as the sole basis for treatment or other patient management decisions. Negative results must be combined with clinical observations, patient history, and epidemiological information. The expected result is Negative.  Fact Sheet for Patients: HairSlick.no  Fact Sheet for Healthcare Providers: quierodirigir.com  This test is not yet approved or cleared by the [place] FDA and  has been authorized for detection and/or diagnosis of SARS-CoV-2 by FDA under an Emergency Use Authorization (EUA). This EUA will remain  in effect (meaning this test can be used) for the duration of the COVID-19 declaration under Se ction 564(b)(1) of the Act, 21 U.S.C. section 360bbb-3(b)(1), unless the authorization is terminated or revoked sooner.  Performed at [facility], [address], [place], [place] [address]     Labs: CBC: Recent Labs  Lab 02/16/23 1035 02/17/23 0508  WBC 6.1 5.3  NEUTROABS 4.2 3.1  HGB 11.1* 10.0*  HCT 33.5* 30.0*  MCV 85.7 84.5  PLT 221 215   Basic Metabolic Panel: Recent Labs  Lab 02/16/23 1035 02/17/23 0508  NA 129* 130*  K 4.1 4.1  CL 97* 100  CO2 24 24  GLUCOSE 124* 105*  BUN 20 19  CREATININE 0.99 0.96  CALCIUM 8.6* 8.3*   Liver Function Tests: Recent Labs  Lab 02/16/23 1035  AST 29  ALT 39  ALKPHOS 47  BILITOT 0.4  PROT 6.7  ALBUMIN 3.2*   CBG: Recent Labs  Lab 02/16/23 1034  GLUCAP 114*    Discharge time spent: greater than 30 minutes.  Signed: Lurene Shadow, MD [facility] 02/17/2023

## 2023-02-17 NOTE — Plan of Care (Addendum)
Patient is alert and orientated x 4. Patient is talking normally. Clear to go home. PIV  Removed, Daughter informed about the discharge plan. Discharge home with self care.  Problem: Education: Goal: Knowledge of disease or condition will improve 02/17/2023 1636 by Kandace Parkins, RN Outcome: Progressing 02/17/2023 0740 by Kandace Parkins, RN Outcome: Not Progressing Goal: Knowledge of secondary prevention will improve (MUST DOCUMENT ALL) 02/17/2023 1636 by Kandace Parkins, RN Outcome: Progressing 02/17/2023 0740 by Kandace Parkins, RN Outcome: Not Progressing Goal: Knowledge of patient specific risk factors will improve Loraine Leriche N/A or DELETE if not current risk factor) 02/17/2023 1636 by Kandace Parkins, RN Outcome: Progressing 02/17/2023 0740 by Kandace Parkins, RN Outcome: Not Progressing   Problem: Ischemic Stroke/TIA Tissue Perfusion: Goal: Complications of ischemic stroke/TIA will be minimized 02/17/2023 1636 by Kandace Parkins, RN Outcome: Progressing 02/17/2023 0740 by Kandace Parkins, RN Outcome: Not Progressing   Problem: Coping: Goal: Will verbalize positive feelings about self 02/17/2023 1636 by Kandace Parkins, RN Outcome: Progressing 02/17/2023 0740 by Kandace Parkins, RN Outcome: Not Progressing Goal: Will identify appropriate support needs 02/17/2023 1636 by Kandace Parkins, RN Outcome: Progressing 02/17/2023 0740 by Kandace Parkins, RN Outcome: Not Progressing   Problem: Health Behavior/Discharge Planning: Goal: Ability to manage health-related needs will improve 02/17/2023 1636 by Kandace Parkins, RN Outcome: Progressing 02/17/2023 0740 by Kandace Parkins, RN Outcome: Not Progressing Goal: Goals will be collaboratively established with patient/family 02/17/2023 1636 by Kandace Parkins, RN Outcome: Progressing 02/17/2023  0740 by Kandace Parkins, RN Outcome: Not Progressing   Problem: Self-Care: Goal: Ability to participate in self-care as condition permits will improve 02/17/2023 1636 by Kandace Parkins, RN Outcome: Progressing 02/17/2023 0740 by Kandace Parkins, RN Outcome: Not Progressing Goal: Verbalization of feelings and concerns over difficulty with self-care will improve 02/17/2023 1636 by Kandace Parkins, RN Outcome: Progressing 02/17/2023 0740 by Kandace Parkins, RN Outcome: Not Progressing Goal: Ability to communicate needs accurately will improve 02/17/2023 1636 by Kandace Parkins, RN Outcome: Progressing 02/17/2023 0740 by Kandace Parkins, RN Outcome: Not Progressing   Problem: Nutrition: Goal: Risk of aspiration will decrease 02/17/2023 1636 by Kandace Parkins, RN Outcome: Progressing 02/17/2023 0740 by Kandace Parkins, RN Outcome: Not Progressing Goal: Dietary intake will improve 02/17/2023 1636 by Kandace Parkins, RN Outcome: Progressing 02/17/2023 0740 by Kandace Parkins, RN Outcome: Not Progressing   Problem: Education: Goal: Knowledge of General Education information will improve Description: Including pain rating scale, medication(s)/side effects and non-pharmacologic comfort measures 02/17/2023 1636 by Kandace Parkins, RN Outcome: Progressing 02/17/2023 0740 by Kandace Parkins, RN Outcome: Not Progressing   Problem: Health Behavior/Discharge Planning: Goal: Ability to manage health-related needs will improve 02/17/2023 1636 by Kandace Parkins, RN Outcome: Progressing 02/17/2023 0740 by Kandace Parkins, RN Outcome: Not Progressing   Problem: Clinical Measurements: Goal: Ability to maintain clinical measurements within normal limits will improve 02/17/2023 1636 by Kandace Parkins, RN Outcome: Progressing 02/17/2023 0740 by  Kandace Parkins, RN Outcome: Not Progressing Goal: Will remain free from infection 02/17/2023 1636 by Kandace Parkins, RN Outcome: Progressing 02/17/2023 0740 by Kandace Parkins, RN Outcome: Not Progressing Goal: Diagnostic test results will improve 02/17/2023 1636 by Kandace Parkins, RN Outcome: Progressing 02/17/2023 0740 by Kandace Parkins, RN Outcome: Not Progressing Goal: Respiratory complications will improve 02/17/2023 1636 by Kandace Parkins, RN Outcome: Progressing 02/17/2023 0740 by Veatrice Kells,  Iszabella Hebenstreit, RN Outcome: Not Progressing Goal: Cardiovascular complication will be avoided 02/17/2023 1636 by Kandace Parkins, RN Outcome: Progressing 02/17/2023 0740 by Kandace Parkins, RN Outcome: Not Progressing   Problem: Activity: Goal: Risk for activity intolerance will decrease 02/17/2023 1636 by Kandace Parkins, RN Outcome: Progressing 02/17/2023 0740 by Kandace Parkins, RN Outcome: Not Progressing   Problem: Nutrition: Goal: Adequate nutrition will be maintained 02/17/2023 1636 by Kandace Parkins, RN Outcome: Progressing 02/17/2023 0740 by Kandace Parkins, RN Outcome: Not Progressing   Problem: Coping: Goal: Level of anxiety will decrease 02/17/2023 1636 by Kandace Parkins, RN Outcome: Progressing 02/17/2023 0740 by Kandace Parkins, RN Outcome: Not Progressing   Problem: Elimination: Goal: Will not experience complications related to bowel motility 02/17/2023 1636 by Kandace Parkins, RN Outcome: Progressing 02/17/2023 0740 by Kandace Parkins, RN Outcome: Not Progressing Goal: Will not experience complications related to urinary retention 02/17/2023 1636 by Kandace Parkins, RN Outcome: Progressing 02/17/2023 0740 by Kandace Parkins, RN Outcome: Not Progressing   Problem: Pain  Managment: Goal: General experience of comfort will improve 02/17/2023 1636 by Kandace Parkins, RN Outcome: Progressing 02/17/2023 0740 by Kandace Parkins, RN Outcome: Not Progressing   Problem: Safety: Goal: Ability to remain free from injury will improve 02/17/2023 1636 by Kandace Parkins, RN Outcome: Progressing 02/17/2023 0740 by Kandace Parkins, RN Outcome: Not Progressing   Problem: Skin Integrity: Goal: Risk for impaired skin integrity will decrease 02/17/2023 1636 by Kandace Parkins, RN Outcome: Progressing 02/17/2023 0740 by Kandace Parkins, RN Outcome: Not Progressing

## 2023-02-17 NOTE — Progress Notes (Signed)
Initial Nutrition Assessment  DOCUMENTATION CODES:   Not applicable  INTERVENTION:   -Obtain new wt -Continue Ensure Enlive po BID, each supplement provides 350 kcal and 20 grams of protein.  -MVI with minerals daily -Continue with regular diet  NUTRITION DIAGNOSIS:   Increased nutrient needs related to chronic illness (COPD) as evidenced by estimated needs.  GOAL:   Patient will meet greater than or equal to 90% of their needs  MONITOR:   PO intake, Supplement acceptance  REASON FOR ASSESSMENT:   Malnutrition Screening Tool    ASSESSMENT:   Pt with medical history significant of prediabetes, stage IIIa CKD, pernicious anemia, hypothyroidism, hyperlipidemia, COPD, BPH, prostate cancer, who presents due to difficulty finding words.  Pt admitted with expressive aphasia.   Reviewed I/O's: +120 ml x 24 hours  Pt unavailable at time of visit. Pt with MD at time of visit and may discharge home tomorrow. RD unable to obtain further nutrition-related history or complete nutrition-focused physical exam at this time.    SLP evaluated pt; pt tolerated breakfast well and no swallowing deficits noted. Pt is on a regular diet. Documented meal completions 100%.   Per H&P, pt reports decreased appetite over the past 3 months.   Per CareEverywhere, pt weighed 174# on 02/06/23 and 180# on 01/19/23. Noted pt has experienced a 3.3% wt loss over the past month, which is not significant for time frame, but concerning given advanced age and decreased appetite. RD will obtain new wt to better assess wt changes.   Medications reviewed.   Labs reviewed: Na: 130, CBGS: 114.    Diet Order:   Diet Order             Diet regular Room service appropriate? Yes with Assist; Fluid consistency: Thin  Diet effective now                   EDUCATION NEEDS:   No education needs have been identified at this time  Skin:  Skin Assessment: Reviewed RN Assessment  Last BM:  02/15/23  Height:    Ht Readings from Last 1 Encounters:  01/06/23 5\' 10"  (1.778 m)    Weight:   Wt Readings from Last 1 Encounters:  01/06/23 81.6 kg    Ideal Body Weight:  75.5 kg  BMI:  There is no height or weight on file to calculate BMI.  Estimated Nutritional Needs:   Kcal:  1850-2050  Protein:  100-115 grams  Fluid:  > 1.8 L    Levada Schilling, RD, LDN, CDCES Registered Dietitian II Certified Diabetes Care and Education Specialist Please refer to [facility] for RD and/or RD on-call/weekend/after hours pager

## 2023-02-17 NOTE — Progress Notes (Signed)
EEG complete - results pending 

## 2023-02-18 NOTE — Procedures (Signed)
Routine EEG Report  Christian Garcia is a 89 y.o. male with a history of altered mental status who is undergoing an EEG to evaluate for seizures.  Report: This EEG was acquired with electrodes placed according to the International 10-20 electrode system (including Fp1, Fp2, F3, F4, C3, C4, P3, P4, O1, O2, T3, T4, T5, T6, A1, A2, Fz, Cz, Pz). The following electrodes were missing or displaced: none.  The occipital dominant rhythm was 8.5 Hz. This activity is reactive to stimulation. Drowsiness was manifested by background fragmentation; deeper stages of sleep were identified by K complexes and sleep spindles. There was no focal slowing. There were no interictal epileptiform discharges. There were no electrographic seizures identified. Photic stimulation and hyperventilation were not performed.  Impression: This EEG was obtained while awake and asleep and is normal.    Clinical Correlation: Normal EEGs, however, do not rule out epilepsy.  Bing Neighbors, MD [facility] [phone]  If 7pm- 7am, please page neurology on call as listed in AMION.

## 2023-02-21 ENCOUNTER — Other Ambulatory Visit: Payer: Self-pay | Admitting: Neurology

## 2023-02-21 DIAGNOSIS — I729 Aneurysm of unspecified site: Secondary | ICD-10-CM

## 2023-02-21 DIAGNOSIS — Q2730 Arteriovenous malformation, site unspecified: Secondary | ICD-10-CM

## 2023-02-21 DIAGNOSIS — R4689 Other symptoms and signs involving appearance and behavior: Secondary | ICD-10-CM

## 2023-02-21 DIAGNOSIS — R4701 Aphasia: Secondary | ICD-10-CM

## 2023-02-21 NOTE — Progress Notes (Signed)
Neurology progress note  S: Patient is feeling well with no neurologic deficits and no new complaints.  O:  Vitals:   02/17/23 1220 02/17/23 1636  BP: 122/62 (!) 155/68  Pulse: 69 67  Resp: 18 19  Temp: 97.6 F (36.4 C)   SpO2: 95% 97%   Physical Exam Gen: A&O x4, NAD HEENT: Atraumatic, normocephalic;mucous membranes moist; oropharynx clear, tongue without atrophy or fasciculations. Neck: Supple, trachea midline. Resp: CTAB, no w/r/r CV: RRR, no m/g/r; nml S1 and S2. 2+ symmetric peripheral pulses. Abd: soft/NT/ND; nabs x 4 quad Extrem: Nml bulk; no cyanosis, clubbing, or edema.   Neuro: *MS: A&O x4. Follows multi-step commands.  *Speech: fluid, nondysarthric, able to name and repeat *CN:    I: Deferred   II,III: PERRLA, VFF by confrontation, optic discs unable to be visualized 2/2 pupillary constriction   III,IV,VI: EOMI w/o nystagmus, no ptosis   V: Sensation intact from V1 to V3 to LT   VII: Eyelid closure was full.  Smile symmetric.   VIII: Hearing intact to voice   IX,X: Voice normal, palate elevates symmetrically    XI: SCM/trap 5/5 bilat   XII: Tongue protrudes midline, no atrophy or fasciculations    *Motor:   Normal bulk.  No tremor, rigidity or bradykinesia. No pronator drift.     Strength: Dlt Bic Tri WrE WrF FgS Gr HF KnF KnE PlF DoF    Left 5 5 5 5 5 5 5 5 5 5 5 5     Right 5 5 5 5 5 5 5 5 5 5 5 5       *Sensory: Intact to light touch, pinprick, temperature vibration throughout. Symmetric. Propioception intact bilat.  No double-simultaneous extinction.  *Coordination:  Finger-to-nose, heel-to-shin, rapid alternating motions were intact. *Reflexes:  2+ and symmetric throughout without clonus; toes down-going bilat *Gait: deferred   NIHSS = 0   Premorbid mRS = 1  Data:  Head CT on admission showed no e/o hemorrhage or acute infarct but did show a 6mm ovoid structure along the course of the L PCA which per radiology may represent a possible aneurysm or  nidus of AVM (personally reviewed and discussed with neuroradiology by phone).   CTA H&N showed:   1. 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 I do not see a vessel egress from this aneurysm however. 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 Numerous abnormal draining veins from this area do raise the possibility that this could even be a venous aneurysm. The more distal PCA branches on the left are visible, but I do not see an intact PCA from the region of the aneurysm to the more distal PCA on the left. Therefore, the differential diagnosis for this includes arterial berry aneurysm, aneurysm associated with arteriovenous malformation and aneurysm associated with arteriovenous fistula. I think catheter angiography would be necessary to fully understand this lesion. The advisability of that in this 89 year old patient must be determined clinically. 2. Aortic atherosclerosis. 3. Calcified plaque in the right ICA bulb. 40% stenosis at the distal bulb. 4. No acute intracranial anterior circulation large vessel occlusion or proximal stenosis.   MRI brain wwo 1. No acute or reversible finding. No brain mass or focal cortical finding to correlate with seizure history. 2. 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   TTE -  no intracardiac clot or other etiology for neurologic deficits identified   Stroke Labs     Component Value Date/Time   CHOL 166 02/16/2023 1035   TRIG 128 02/16/2023 1035   HDL 27 (L) 02/16/2023 1035   CHOLHDL 6.1 02/16/2023 1035   VLDL 26 02/16/2023 1035   LDLCALC 113 (H) 02/16/2023 1035    Lab Results  Component Value Date/Time   HGBA1C 6.3 (H) 02/16/2023 02:25 PM   A/P: This is a 89 yo man with pmhx COPD, hypothyroidism and prostate cancer who presented  with 4 discrete episodes of word-finding difficulty on day of admission, currently asymptomatic, c/f TIAs vs new onset seizure. MRI brain showed no e/o acute infarct or other emergent findings. CTA did show multiple associated vascular abnormalities in the L PCA region. I discussed these findings at length with our neurointerventionalist Dr. Corliss Skains. He reviewed the images and favors AVM with adjacent aneurysm. Dr. Corliss Skains recommended catheter angiogram for further characterization and treatment if indicated based on those results. This may be done as an outpatient; I will arrange close f/u with Dr. Corliss Skains to do this. Patient and family are amenable to outpatient consult.  The AVM could serve as a nidus for seizure, which may explain his recurrent word finding difficulty. EEG was normal and there is no indication to start anti-epileptics at this time, although they should be reconsidered if these episodes recur.  Ddx for the lesion favored to be AVM could also include (less likely) oligodendroglioma based on the amount of adjacent calcification. Recommend MRI brain wwo in 3 mos to assess for stability if catheter angiogram does not confirm expected vascular lesion.  - OK to discharge home - ASA 81mg  daily + plavix 75mg  daily x21 days f/b ASA 81mg  daily monotherapy after that - I will arrange outpatient f/u with Dr. Corliss Skains for consideration of catheter angiogram - I will arrange outpatient neurology f/u - MRI brain wwo in 3 mos to assess for stability if catheter angiogram does not confirm expected vascular lesion  Neurology to sign off but please re-engage if additional neurologic concerns arise.  Bing Neighbors, MD [facility] [phone]  If 7pm- 7am, please page neurology on call as listed in AMION.

## 2023-02-21 NOTE — Progress Notes (Signed)
Outpatient referrals were placed for:  - Dr. Corliss Skains, neurointervention, for consideration of catheter angiogram - Neurology for hospital f/u  Bing Neighbors, MD [facility] [phone]  If 7pm- 7am, please page neurology on call as listed in AMION.

## 2023-02-25 ENCOUNTER — Other Ambulatory Visit (HOSPITAL_COMMUNITY): Payer: Self-pay | Admitting: Neurology

## 2023-02-25 DIAGNOSIS — Q282 Arteriovenous malformation of cerebral vessels: Secondary | ICD-10-CM

## 2023-02-28 ENCOUNTER — Other Ambulatory Visit: Payer: Self-pay

## 2023-02-28 ENCOUNTER — Emergency Department: Payer: Medicare HMO

## 2023-02-28 ENCOUNTER — Observation Stay: Payer: Medicare HMO

## 2023-02-28 ENCOUNTER — Observation Stay
Admission: EM | Admit: 2023-02-28 | Discharge: 2023-03-01 | Disposition: A | Discharge status: Home / Self Care | Payer: Medicare HMO | Attending: Internal Medicine | Admitting: Internal Medicine

## 2023-02-28 DIAGNOSIS — E039 Hypothyroidism, unspecified: Secondary | ICD-10-CM | POA: Insufficient documentation

## 2023-02-28 DIAGNOSIS — J449 Chronic obstructive pulmonary disease, unspecified: Secondary | ICD-10-CM | POA: Diagnosis not present

## 2023-02-28 DIAGNOSIS — Z8546 Personal history of malignant neoplasm of prostate: Secondary | ICD-10-CM | POA: Insufficient documentation

## 2023-02-28 DIAGNOSIS — Z79899 Other long term (current) drug therapy: Secondary | ICD-10-CM | POA: Diagnosis not present

## 2023-02-28 DIAGNOSIS — N1831 Chronic kidney disease, stage 3a: Secondary | ICD-10-CM | POA: Diagnosis not present

## 2023-02-28 DIAGNOSIS — Z7982 Long term (current) use of aspirin: Secondary | ICD-10-CM | POA: Diagnosis not present

## 2023-02-28 DIAGNOSIS — R404 Transient alteration of awareness: Secondary | ICD-10-CM | POA: Diagnosis not present

## 2023-02-28 DIAGNOSIS — R41841 Cognitive communication deficit: Secondary | ICD-10-CM | POA: Diagnosis not present

## 2023-02-28 DIAGNOSIS — E872 Acidosis, unspecified: Secondary | ICD-10-CM | POA: Diagnosis not present

## 2023-02-28 DIAGNOSIS — I10 Essential (primary) hypertension: Secondary | ICD-10-CM | POA: Diagnosis present

## 2023-02-28 DIAGNOSIS — I671 Cerebral aneurysm, nonruptured: Secondary | ICD-10-CM | POA: Diagnosis present

## 2023-02-28 DIAGNOSIS — R4182 Altered mental status, unspecified: Secondary | ICD-10-CM | POA: Diagnosis present

## 2023-02-28 DIAGNOSIS — N179 Acute kidney failure, unspecified: Secondary | ICD-10-CM | POA: Insufficient documentation

## 2023-02-28 DIAGNOSIS — Z7902 Long term (current) use of antithrombotics/antiplatelets: Secondary | ICD-10-CM | POA: Diagnosis not present

## 2023-02-28 DIAGNOSIS — I129 Hypertensive chronic kidney disease with stage 1 through stage 4 chronic kidney disease, or unspecified chronic kidney disease: Secondary | ICD-10-CM | POA: Insufficient documentation

## 2023-02-28 DIAGNOSIS — Z1152 Encounter for screening for COVID-19: Secondary | ICD-10-CM | POA: Insufficient documentation

## 2023-02-28 DIAGNOSIS — E871 Hypo-osmolality and hyponatremia: Secondary | ICD-10-CM | POA: Insufficient documentation

## 2023-02-28 DIAGNOSIS — E785 Hyperlipidemia, unspecified: Secondary | ICD-10-CM | POA: Diagnosis present

## 2023-02-28 LAB — URINE DRUG SCREEN, QUALITATIVE (ARMC ONLY)
Amphetamines, Ur Screen: NOT DETECTED
Barbiturates, Ur Screen: NOT DETECTED
Benzodiazepine, Ur Scrn: NOT DETECTED
Cannabinoid 50 Ng, Ur ~~LOC~~: NOT DETECTED
Cocaine Metabolite,Ur ~~LOC~~: NOT DETECTED
MDMA (Ecstasy)Ur Screen: NOT DETECTED
Methadone Scn, Ur: NOT DETECTED
Opiate, Ur Screen: NOT DETECTED
Phencyclidine (PCP) Ur S: NOT DETECTED
Tricyclic, Ur Screen: NOT DETECTED

## 2023-02-28 LAB — OSMOLALITY, URINE: Osmolality, Ur: 352 mOsm/kg (ref 300–900)

## 2023-02-28 LAB — URINALYSIS, COMPLETE (UACMP) WITH MICROSCOPIC
Bacteria, UA: NONE SEEN
Bilirubin Urine: NEGATIVE
Glucose, UA: NEGATIVE mg/dL
Hgb urine dipstick: NEGATIVE
Ketones, ur: NEGATIVE mg/dL
Leukocytes,Ua: NEGATIVE
Nitrite: NEGATIVE
Protein, ur: NEGATIVE mg/dL
Specific Gravity, Urine: 1.016 (ref 1.005–1.030)
Squamous Epithelial / HPF: NONE SEEN /HPF (ref 0–5)
pH: 6 (ref 5.0–8.0)

## 2023-02-28 LAB — RESP PANEL BY RT-PCR (RSV, FLU A&B, COVID)  RVPGX2
Influenza A by PCR: NEGATIVE
Influenza B by PCR: NEGATIVE
Resp Syncytial Virus by PCR: NEGATIVE
SARS Coronavirus 2 by RT PCR: NEGATIVE

## 2023-02-28 LAB — COMPREHENSIVE METABOLIC PANEL
ALT: 26 U/L (ref 0–44)
AST: 27 U/L (ref 15–41)
Albumin: 3.9 g/dL (ref 3.5–5.0)
Alkaline Phosphatase: 42 U/L (ref 38–126)
Anion gap: 13 (ref 5–15)
BUN: 31 mg/dL — ABNORMAL HIGH (ref 8–23)
CO2: 19 mmol/L — ABNORMAL LOW (ref 22–32)
Calcium: 9.1 mg/dL (ref 8.9–10.3)
Chloride: 94 mmol/L — ABNORMAL LOW (ref 98–111)
Creatinine, Ser: 1.26 mg/dL — ABNORMAL HIGH (ref 0.61–1.24)
GFR, Estimated: 54 mL/min — ABNORMAL LOW (ref >60)
Glucose, Bld: 118 mg/dL — ABNORMAL HIGH (ref 70–99)
Potassium: 4.6 mmol/L (ref 3.5–5.1)
Sodium: 126 mmol/L — ABNORMAL LOW (ref 135–145)
Total Bilirubin: 0.8 mg/dL (ref 0.3–1.2)
Total Protein: 7.5 g/dL (ref 6.5–8.1)

## 2023-02-28 LAB — CBC
HCT: 32.4 % — ABNORMAL LOW (ref 39.0–52.0)
Hemoglobin: 11 g/dL — ABNORMAL LOW (ref 13.0–17.0)
MCH: 28.3 pg (ref 26.0–34.0)
MCHC: 34 g/dL (ref 30.0–36.0)
MCV: 83.3 fL (ref 80.0–100.0)
Platelets: 255 10*3/uL (ref 150–400)
RBC: 3.89 MIL/uL — ABNORMAL LOW (ref 4.22–5.81)
RDW: 12.2 % (ref 11.5–15.5)
WBC: 5.1 10*3/uL (ref 4.0–10.5)
nRBC: 0 % (ref 0.0–0.2)

## 2023-02-28 LAB — PROCALCITONIN: Procalcitonin: <0.1 ng/mL

## 2023-02-28 LAB — OSMOLALITY: Osmolality: 274 mOsm/kg — ABNORMAL LOW (ref 275–295)

## 2023-02-28 LAB — SODIUM, URINE, RANDOM: Sodium, Ur: 47 mmol/L

## 2023-02-28 LAB — VITAMIN B12: Vitamin B-12: 1078 pg/mL — ABNORMAL HIGH (ref 180–914)

## 2023-02-28 LAB — TROPONIN I (HIGH SENSITIVITY): Troponin I (High Sensitivity): 15 ng/L (ref <18)

## 2023-02-28 MED ORDER — SODIUM CHLORIDE 0.9 % IV SOLN: Freq: Once | INTRAVENOUS | Status: AC

## 2023-02-28 MED ORDER — HYDRALAZINE HCL 20 MG/ML IJ SOLN: 5.0000 mg | Freq: Four times a day (QID) | INTRAMUSCULAR | Status: DC | PRN

## 2023-02-28 MED ORDER — SENNOSIDES-DOCUSATE SODIUM 8.6-50 MG PO TABS: 1.0000 | ORAL_TABLET | Freq: Every evening | ORAL | Status: DC | PRN

## 2023-02-28 MED ORDER — ACETAMINOPHEN 650 MG RE SUPP: 650.0000 mg | RECTAL | Status: DC | PRN

## 2023-02-28 MED ORDER — STROKE: EARLY STAGES OF RECOVERY BOOK: Freq: Once | Status: DC

## 2023-02-28 MED ORDER — POLYETHYLENE GLYCOL 3350 17 G PO PACK: 17.0000 g | PACK | Freq: Every day | ORAL | Status: DC | PRN

## 2023-02-28 MED ORDER — ACETAMINOPHEN 160 MG/5ML PO SOLN: 650.0000 mg | ORAL | Status: DC | PRN

## 2023-02-28 MED ORDER — IOHEXOL 350 MG/ML SOLN
75.0000 mL | Freq: Once | INTRAVENOUS | Status: AC | PRN
  Administered 2023-02-28: 75 mL via INTRAVENOUS

## 2023-02-28 MED ORDER — ACETAMINOPHEN 325 MG PO TABS: 650.0000 mg | ORAL_TABLET | ORAL | Status: DC | PRN

## 2023-02-28 NOTE — H&P (Addendum)
History and Physical   DEE STEENO ZOX:[id number] DOB: Feb 27, 1929 DOA: 02/28/2023  PCP: Lauro Regulus, MD  Patient coming from: home via EMS  I have personally briefly reviewed patient's old medical records in [facility] EMR.  Chief Concern: Altered mental status  HPI: 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  Vitals in the emergency department showed temperature of 98.9, respiration rate of 18, heart rate 75, blood pressure 156/73, SpO2 of 96% on room air.  Serum sodium is 126, potassium 4.6, chloride 94, bicarb 19, BUN of 31, serum creatinine 1.26, eGFR 54, nonfasting blood glucose 118, WBC 5.1, hemoglobin 11, platelets of 255.  ED treatment: Sodium chloride infusion 125 ml/hr one-time dose. ----------------------- At bedside, patient is sitting up in hospital bed, does not appear to be in acute distress.  I asked Christian Garcia, 3 times for him to tell me his name and his age.  He will repeat the question or he will state, 'uh huh' but does not answer my question.  Nursing reports that patient has intermittent episodes of being able to state his name and/or even spelled out his name.  However sometimes he will not respond and answer his name.  Patient developed confusion today and called daughter asking where the ambulance was.  Of note: Patient had an episode of confusion 2 weeks ago which resolved and was presumed to be TIA.  In-house neurology was consulted and recommended addition of Plavix to patient's baseline aspirin.  Patient took the Plavix as well as the aspirin for about 1 week and discontinued it due to bleeding in his upper extremities.  Discussion was made with outpatient PCP who agreed with discontinuing the Plavix given patient's age.  Social history: Patient currently lives alone.  ROS: Unable to complete due to  altered mental status.  ED Course: Discussed with emergency medicine provider, patient requiring hospitalization for chief concerns of TIA, altered mental status.  Assessment/Plan  Principal Problem:   Altered mental status Active Problems:   Brain aneurysm   Essential hypertension   Stage 3a chronic kidney disease (HCC)   Acquired hypothyroidism   HLD (hyperlipidemia)   History of prostate cancer   Hyponatremia   Metabolic acidosis   AKI (acute kidney injury) (HCC)   Assessment and Plan:  * Altered mental status Differentials include TIA, B12 deficiency, infectious etiology, UTI, pneumonia Etiology workup in progress, check UA ordered and pending collection, portable chest x-ray, procalcitonin, serum B12 level CTA head and neck w and wo contrast material ordered by EDP is pending imaging and read Check a one time troponin level Neurology service has been offered, daughter declines at this time stating that she has already discussed her father with a neurologist Admit to telemetry medical, observation  Stage 3a chronic kidney disease (HCC) At baseline  AKI (acute kidney injury) (HCC) vs increase in sCr level Baseline serum creatinine is 0.96-1.10/eGFR > 60 Status post sodium chloride 125 mL/h, one-time dose ordered by EDP Recheck BMP in a.m.  Metabolic acidosis With increase in serum creatinine level  Hyponatremia Check serum osmolality, urine osmolality, urine sodium level Patient is status post sodium chloride infusion at 125 mL/h, one-time treatment ordered by EDP Recheck BMP in the a.m.  Chart reviewed.   DVT prophylaxis: TED hose Code Status: Full code Diet: Heart healthy Family Communication: Called and discussed with patient's daughter, Christian Garcia over the  phone at [phone] Disposition Plan: Pending clinical course Consults called: None at this time Admission status: Telemetry medical, observation  Past Medical History:  Diagnosis Date   Altered bowel  habits    Anemia    Aneurysm (HCC) 02/2023   brain aneurysm, diagnosed 7/24   BPH (benign prostatic hypertrophy)    COPD (chronic obstructive pulmonary disease) (HCC)    pt and sister state that does NOT have COPD   Elevated PSA    History of small bowel obstruction    Hypothyroidism    Peyronie's disease    Prostate cancer [facility])    Past Surgical History:  Procedure Laterality Date   APPENDECTOMY     CATARACT EXTRACTION W/PHACO Left 03/24/2017   Procedure: CATARACT EXTRACTION PHACO AND INTRAOCULAR LENS PLACEMENT (IOC) LEFT;  Surgeon: Lockie Mola, MD;  Location: [facility];  Service: Ophthalmology;  Laterality: Left;   CATARACT EXTRACTION W/PHACO Right 05/05/2017   Procedure: CATARACT EXTRACTION PHACO AND INTRAOCULAR LENS PLACEMENT (IOC) RIGHT;  Surgeon: Lockie Mola, MD;  Location: [facility];  Service: Ophthalmology;  Laterality: Right;   COLONOSCOPY WITH PROPOFOL N/A 02/26/2020   Procedure: COLONOSCOPY WITH PROPOFOL;  Surgeon: Regis Bill, MD;  Location: [facility];  Service: Endoscopy;  Laterality: N/A;   CRYOABLATION  2009   prostate   ESOPHAGOGASTRODUODENOSCOPY (EGD) WITH PROPOFOL N/A 02/26/2020   Procedure: ESOPHAGOGASTRODUODENOSCOPY (EGD) WITH PROPOFOL;  Surgeon: Regis Bill, MD;  Location: [facility];  Service: Endoscopy;  Laterality: N/A;   FOOT SURGERY  right   ganglion cyst removed   PROSTATE SURGERY     Social History:  reports that he has never smoked. He has never used smokeless tobacco. He reports that he does not drink alcohol and does not use drugs.  Allergies  Allergen Reactions   Sulfa Antibiotics Other (See Comments)    Red skin   Family History  Problem Relation Age of Onset   Prostate cancer Brother        x 3   Breast cancer Sister    Kidney disease Neg Hx    Kidney cancer Neg Hx    Bladder Cancer Neg Hx    Family history: Family history reviewed and not pertinent.  Prior to Admission  medications   Medication Sig Start Date End Date Taking? Authorizing Provider  aspirin EC 81 MG tablet Take 1 tablet (81 mg total) by mouth daily. Swallow whole. 02/18/23  Yes Lurene Shadow, MD  atorvastatin (LIPITOR) 40 MG tablet Take 1 tablet (40 mg total) by mouth daily. 02/18/23  Yes Lurene Shadow, MD  levothyroxine (SYNTHROID) 75 MCG tablet Take 75 mcg by mouth daily before breakfast. 08/15/19  Yes [provider]  ondansetron (ZOFRAN) 4 MG tablet Take 1 tablet (4 mg total) by mouth every 8 (eight) hours as needed for vomiting or nausea. 01/06/23  Yes Phineas Semen, MD  traZODone (DESYREL) 50 MG tablet Take 1 tablet by mouth at bedtime. 02/22/23 02/22/24 Yes [provider]  clopidogrel (PLAVIX) 75 MG tablet Take 1 tablet (75 mg total) by mouth daily for 20 days. Patient not taking: Reported on 02/28/2023 02/18/23 03/10/23  Lurene Shadow, MD  Cyanocobalamin 1000 MCG/ML KIT Inject 1,000 mcg into the muscle every 30 (thirty) days.     [provider]    Physical Exam: Vitals:   02/28/23 1320 02/28/23 1323 02/28/23 1340 02/28/23 1635  BP:   (!) 156/73 (!) 161/97  Pulse:  78 75 70  Resp:   18 17  Temp:  97.9 F (36.6 C)  TempSrc:    Oral  SpO2:   96% 97%  Weight: 79.8 kg     Height: 5\' 7"  (1.702 m)      Constitutional: appears age-appropriate, frail, NAD, calm Eyes: PERRL, lids and conjunctivae normal ENMT: Mucous membranes are moist. Posterior pharynx clear of any exudate or lesions. Age-appropriate dentition. Hearing appropriate Neck: normal, supple, no masses, no thyromegaly Respiratory: clear to auscultation bilaterally, no wheezing, no crackles. Normal respiratory effort. No accessory muscle use.  Cardiovascular: Regular rate and rhythm, no murmurs / rubs / gallops. No extremity edema. 2+ pedal pulses. No carotid bruits.  Abdomen: no tenderness, no masses palpated, no hepatosplenomegaly. Bowel sounds positive.  Musculoskeletal: no clubbing / cyanosis. No  joint deformity upper and lower extremities. Good ROM, no contractures, no atrophy. Normal muscle tone.  Skin: no rashes, lesions, ulcers. No induration Neurologic: Sensation intact. Strength 5/5 in all 4.  Psychiatric: Lacks judgment and insight.  Patient is awake and not oriented. Normal mood.   EKG: EKG ordered  Chest x-ray on Admission: I personally reviewed and I agree with radiologist reading as below.  CT ANGIO HEAD NECK W WO CM  Result Date: 02/28/2023 CLINICAL DATA:  TIA. EXAM: CT ANGIOGRAPHY HEAD AND NECK WITH AND WITHOUT CONTRAST TECHNIQUE: Multidetector CT imaging of the head and neck was performed using the standard protocol during bolus administration of intravenous contrast. Multiplanar CT image reconstructions and MIPs were obtained to evaluate the vascular anatomy. Carotid stenosis measurements (when applicable) are obtained utilizing NASCET criteria, using the distal internal carotid diameter as the denominator. RADIATION DOSE REDUCTION: This exam was performed according to the departmental dose-optimization program which includes automated exposure control, adjustment of the mA and/or kV according to patient size and/or use of iterative reconstruction technique. CONTRAST:  75mL OMNIPAQUE IOHEXOL 350 MG/ML SOLN COMPARISON:  MR head without and with contrast 02/16/2023. CT angio head and neck 02/16/2023. FINDINGS: CT HEAD FINDINGS Brain: Asymmetric calcification of left basal ganglia is again noted. No acute infarct, hemorrhage, or mass lesion is present. No significant white matter lesions are present. Deep brain nuclei are within normal limits. The ventricles are of normal size. No significant extraaxial fluid collection is present. The brainstem and cerebellum are within normal limits. Midline structures are within normal limits. Vascular: No hyperdense vessel or unexpected calcification. Skull: Calvarium is intact. No focal lytic or blastic lesions are present. No significant  extracranial soft tissue lesion is present. Sinuses/Orbits: The paranasal sinuses and mastoid air cells are clear. Bilateral lens replacements are noted. Globes and orbits are otherwise unremarkable. Review of the MIP images confirms the above findings CTA NECK FINDINGS Aortic arch: Atherosclerotic calcifications are again noted at the aortic arch and great vessel origins. No focal stenosis is present. Right carotid system: Right common carotid artery is within normal limits. Atherosclerotic changes are again noted at the proximal right ICA without significant stenosis relative to the more distal vessel. More distal right ICA is normal. Left carotid system: The left common carotid artery is within normal limits. Bifurcation is unremarkable. Cervical left ICA is normal. Vertebral arteries: The right vertebral artery is the dominant vessel. Both vertebral arteries originate from the subclavian arteries. No significant stenosis is present in either vertebral artery in the neck. Skeleton: Multilevel degenerative changes of the cervical spine are stable. Other neck: Soft tissues the neck are otherwise unremarkable. Salivary glands are within normal limits. Thyroid is normal. No significant adenopathy is present. No focal mucosal or submucosal lesions  are present. Upper chest: The lung apices are clear. The thoracic inlet is within normal limits. Review of the MIP images confirms the above findings CTA HEAD FINDINGS Anterior circulation: The internal carotid arteries are within normal limits skull base the ICA termini. The A1 and M1 segments are normal. The anterior communicating artery is patent. The MCA bifurcations are normal. The ACA and MCA branch vessels are within normal limits. No aneurysm is present. Posterior circulation: The vertebral arteries are codominant. PICA origins are visualized and normal. Vertebrobasilar junction basilar artery are normal. Both posterior cerebral arteries scratched at the superior  cerebellar arteries are patent. Both posterior cerebral arteries originate basilar tip. A 5.5 mm aneurysm is again noted extending inferiorly from the region proximal left P1 segment. As mention on the prior study, this could be venous. A separate developmental venous anomaly extends into the area of calcification in left basal ganglia. The PCA branch vessels are within normal limits bilaterally. Venous sinuses: The dural sinuses patent. The developmental venous anomaly extends into the internal cerebral veins. Anatomic variants: None Review of the MIP images confirms the above findings IMPRESSION: 1. Stable noncontrast CT of the head.  No acute abnormality. 2. No large vessel occlusion. 3. Stable appearance of 5.5 mm aneurysm extending inferiorly from the region of the proximal left P1 segment. As mentioned on the prior study, this could be venous. Clinically indicated, catheter angiogram could further characterize this lesion. 4. No other significant proximal stenosis, aneurysm, or branch vessel occlusion within the Circle of Willis. 5. Stable mild atherosclerotic changes at the proximal right ICA without significant stenosis relative to the more distal vessel. 6. Stable asymmetric calcification of the left basal ganglia. 7. Stable multilevel degenerative changes of the cervical spine. 8.  Aortic Atherosclerosis (ICD10-I70.0). Electronically Signed   By: Marin Roberts M.D.   On: 02/28/2023 16:17   DG Chest Port 1 View  Result Date: 02/28/2023 CLINICAL DATA:  [id number] Altered mental status [id number] Weakness [id number] EXAM: PORTABLE CHEST 1 VIEW COMPARISON:  January 18, 2009 FINDINGS: The cardiomediastinal silhouette is unchanged in contour.Similar biapical scarring and dense osteophytes overlying the costomanubrial junctions. Atherosclerotic calcifications. No pleural effusion. No pneumothorax. No acute pleuroparenchymal abnormality. IMPRESSION: No acute cardiopulmonary abnormality. Electronically Signed    By: Meda Klinefelter M.D.   On: 02/28/2023 15:46    Labs on Admission: I have personally reviewed following labs  CBC: Recent Labs  Lab 02/28/23 1324  WBC 5.1  HGB 11.0*  HCT 32.4*  MCV 83.3  PLT 255   Basic Metabolic Panel: Recent Labs  Lab 02/28/23 1324  NA 126*  K 4.6  CL 94*  CO2 19*  GLUCOSE 118*  BUN 31*  CREATININE 1.26*  CALCIUM 9.1   GFR: Estimated Creatinine Clearance: 37.9 mL/min (A) (by C-G formula based on SCr of 1.26 mg/dL (H)).  Liver Function Tests: Recent Labs  Lab 02/28/23 1324  AST 27  ALT 26  ALKPHOS 42  BILITOT 0.8  PROT 7.5  ALBUMIN 3.9   Urine analysis:    Component Value Date/Time   COLORURINE YELLOW (A) 02/28/2023 1407   APPEARANCEUR CLEAR (A) 02/28/2023 1407   LABSPEC 1.016 02/28/2023 1407   PHURINE 6.0 02/28/2023 1407   GLUCOSEU NEGATIVE 02/28/2023 1407   HGBUR NEGATIVE 02/28/2023 1407   BILIRUBINUR NEGATIVE 02/28/2023 1407   KETONESUR NEGATIVE 02/28/2023 1407   PROTEINUR NEGATIVE 02/28/2023 1407   UROBILINOGEN 0.2 01/18/2009 1926   NITRITE NEGATIVE 02/28/2023 1407   LEUKOCYTESUR NEGATIVE 02/28/2023 1407  This document was prepared using Dragon Voice Recognition software and may include unintentional dictation errors.  Dr. Sedalia Muta [facility]  If 7PM-7AM, please contact overnight-coverage provider If 7AM-7PM, please contact day attending provider [email or web address]  02/28/2023, 6:27 PM

## 2023-02-28 NOTE — Assessment & Plan Note (Signed)
At baseline 

## 2023-02-28 NOTE — ED Triage Notes (Signed)
Pt to ED with sister POV for AMS since this morning. Wife states pt was not making sense. Pt is oriented to self, place, situation but not year. Sister states pt has cerebral aneurysm and hx TIA. Pt takes 81mg  aspirin but no thinners. Was on plavix after TIA but taken off after 1 week.   Pt hungry, did not eat today. Denies headache, vision changes. Sister states pt was having trouble finding words on way here but is back to normal now. Pt has no arm drift, face symmetric. Steady gait, no unilateral weakness noted.

## 2023-02-28 NOTE — Assessment & Plan Note (Signed)
With increase in serum creatinine level

## 2023-02-28 NOTE — Assessment & Plan Note (Addendum)
Differentials include TIA, B12 deficiency, infectious etiology, UTI, pneumonia Etiology workup in progress, check UA ordered and pending collection, portable chest x-ray, procalcitonin, serum B12 level CTA head and neck w and wo contrast material ordered by EDP is pending imaging and read Check a one time troponin level Neurology service has been offered, daughter declines at this time stating that she has already discussed her father with a neurologist Admit to telemetry medical, observation

## 2023-02-28 NOTE — Assessment & Plan Note (Addendum)
vs increase in sCr level Baseline serum creatinine is 0.96-1.10/eGFR > 60 Status post sodium chloride 125 mL/h, one-time dose ordered by EDP Recheck BMP in a.m.

## 2023-02-28 NOTE — ED Notes (Signed)
Advised nurse that patient has ready bed 

## 2023-02-28 NOTE — Plan of Care (Signed)
Pt is alert and oriented x 3 confused at times., VSS. Patient has history of aneurysm AVM on ASA at home, was recently admitted for confusion on 02/17/23. Pt passed swallow screening in ED, pt is ambulatory with one person assist.  Problem: Education: Goal: Knowledge of disease or condition will improve Outcome: Not Progressing Goal: Knowledge of secondary prevention will improve (MUST DOCUMENT ALL) Outcome: Not Progressing Goal: Knowledge of patient specific risk factors will improve Loraine Leriche N/A or DELETE if not current risk factor) Outcome: Not Progressing   Problem: Ischemic Stroke/TIA Tissue Perfusion: Goal: Complications of ischemic stroke/TIA will be minimized Outcome: Not Progressing   Problem: Coping: Goal: Will verbalize positive feelings about self Outcome: Not Progressing Goal: Will identify appropriate support needs Outcome: Not Progressing   Problem: Health Behavior/Discharge Planning: Goal: Ability to manage health-related needs will improve Outcome: Not Progressing Goal: Goals will be collaboratively established with patient/family Outcome: Not Progressing   Problem: Self-Care: Goal: Ability to participate in self-care as condition permits will improve Outcome: Not Progressing Goal: Verbalization of feelings and concerns over difficulty with self-care will improve Outcome: Not Progressing Goal: Ability to communicate needs accurately will improve Outcome: Not Progressing   Problem: Nutrition: Goal: Risk of aspiration will decrease Outcome: Not Progressing Goal: Dietary intake will improve Outcome: Not Progressing   Problem: Education: Goal: Knowledge of General Education information will improve Description: Including pain rating scale, medication(s)/side effects and non-pharmacologic comfort measures Outcome: Not Progressing   Problem: Health Behavior/Discharge Planning: Goal: Ability to manage health-related needs will improve Outcome: Not Progressing    Problem: Clinical Measurements: Goal: Ability to maintain clinical measurements within normal limits will improve Outcome: Not Progressing Goal: Will remain free from infection Outcome: Not Progressing Goal: Diagnostic test results will improve Outcome: Not Progressing Goal: Respiratory complications will improve Outcome: Not Progressing Goal: Cardiovascular complication will be avoided Outcome: Not Progressing   Problem: Activity: Goal: Risk for activity intolerance will decrease Outcome: Not Progressing   Problem: Nutrition: Goal: Adequate nutrition will be maintained Outcome: Not Progressing   Problem: Coping: Goal: Level of anxiety will decrease Outcome: Not Progressing   Problem: Elimination: Goal: Will not experience complications related to bowel motility Outcome: Not Progressing Goal: Will not experience complications related to urinary retention Outcome: Not Progressing   Problem: Pain Managment: Goal: General experience of comfort will improve Outcome: Not Progressing   Problem: Safety: Goal: Ability to remain free from injury will improve Outcome: Not Progressing   Problem: Skin Integrity: Goal: Risk for impaired skin integrity will decrease Outcome: Not Progressing

## 2023-02-28 NOTE — Hospital Course (Signed)
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  Vitals in the emergency department showed temperature of 98.9, respiration rate of 18, heart rate 75, blood pressure 156/73, SpO2 of 96% on room air.  Serum sodium is 126, potassium 4.6, chloride 94, bicarb 19, BUN of 31, serum creatinine 1.26, eGFR 54, nonfasting blood glucose 118, WBC 5.1, hemoglobin 11, platelets of 255.  ED treatment: Sodium chloride infusion 125 ml/hr one-time dose.

## 2023-02-28 NOTE — ED Provider Notes (Signed)
[facility] Provider Note    Event Date/Time   First MD Initiated Contact with Patient 02/28/23 1338     (approximate)  History   Chief Complaint: Altered Mental Status  HPI  Christian Garcia is a 89 y.o. male with a past medical history of a aneurysm/brain AVM, anemia, COPD, presents to the emergency department for confusion.  Patient's sister is here with the patient, she states the patient lives alone and typically is independent and cares for himself.  However approximate 2 weeks ago he had an episode of confusion was ultimately brought to the hospital and discharged on aspirin Plavix for presumed TIA.  Patient at that time was found to have a AVM in the brain and was referred to neurointerventional list however daughter with whom I spoke to on the phone (currently lives in [place]) states that his PCP had canceled that appointment with neuro interventionalists as they did not believe that the patient was a surgical candidate and family did not want to pursue that route.  Patient was started on aspirin and Plavix by our neurologist during his visit to the emergency department however patient was having bleeding issues on his skin on Plavix so his PCP took him off of Plavix but left him on baby aspirin.  He states the patient has been doing pretty well until today when the patient once again became confused.  Daughter states the patient was going from room to room in the house calling for an ambulance asking where the ambulance was but had not actually called 911 for an ambulance.  Also states he could not figure out how to use his cell phone or the microwave this morning which is very unlike the patient per daughter.  Here the patient is awake alert he is still having trouble using his cell phone which the sister states normally he would not have any issues.  Patient has been ambulatory this morning without issue.  Physical Exam   Triage Vital Signs: ED Triage  Vitals  Encounter Vitals Group     BP 02/28/23 1315 (!) 140/73     Systolic BP Percentile --      Diastolic BP Percentile --      Pulse Rate 02/28/23 1323 78     Resp 02/28/23 1315 20     Temp 02/28/23 1315 98.9 F (37.2 C)     Temp Source 02/28/23 1315 Oral     SpO2 02/28/23 1315 94 %     Weight 02/28/23 1320 176 lb (79.8 kg)     Height 02/28/23 1320 5\' 7"  (1.702 m)     Head Circumference --      Peak Flow --      Pain Score 02/28/23 1316 0     Pain Loc --      Pain Education --      Exclude from Growth Chart --     Most recent vital signs: Vitals:   02/28/23 1323 02/28/23 1340  BP:  (!) 156/73  Pulse: 78 75  Resp:  18  Temp:    SpO2:  96%    General: Awake, no distress.  Does have some confusion, trouble using his cell phone. CV:  Good peripheral perfusion.  Regular rate and rhythm  Resp:  Normal effort.  Equal breath sounds bilaterally.  Abd:  No distention.  Soft, nontender.  No rebound or guarding.  ED Results / Procedures / Treatments   RADIOLOGY  Reviewed interpret the CT head images.  I see the area in the left brain of AVM however I reviewed the images on the MRI this appears to be largely unchanged but awaiting radiology read to further evaluate.  I do not see any obvious bleed.   MEDICATIONS ORDERED IN ED: Medications  0.9 %  sodium chloride infusion (has no administration in time range)  iohexol (OMNIPAQUE) 350 MG/ML injection 75 mL (has no administration in time range)     IMPRESSION / MDM / ASSESSMENT AND PLAN / ED COURSE  I reviewed the triage vital signs and the nursing notes.  Patient's presentation is most consistent with acute presentation with potential threat to life or bodily function.  Patient presents emergency department for intermittent confusion, worse this morning.  I reviewed the patient's chart he had an MRI performed 12 days ago that showed a 5 mm AVM but no other significant finding.  The remainder the patient's workup is largely  within normal limits although urinalysis was not obtained it appears during that visit.  Given the patient's intermittent confusion we will check labs to look for any metabolic abnormality we will obtain a urine sample to rule out urinary tract infection as well as a COVID swab to evaluate for possible COVID.  Given the patient's recently diagnosed 5 mm AVM we will obtain CTA imaging of the head and neck to evaluate for any possible changes in that.  Patient daughter and sister are agreeable to plan of care.   Patient is lab work has resulted showing a reassuring CBC however patient appears to have hyponatremia with a sodium 126 as well as some mild renal insufficiency compared to his baseline.  Given the patient's hyponatremia we will plan to admit to the hospitalist service.  I have started the patient on normal saline infusion.  FINAL CLINICAL IMPRESSION(S) / ED DIAGNOSES   Hyponatremia Confusion  Note:  This document was prepared using Dragon voice recognition software and may include unintentional dictation errors.   Minna Antis, MD 02/28/23 1501

## 2023-02-28 NOTE — Assessment & Plan Note (Signed)
Check serum osmolality, urine osmolality, urine sodium level Patient is status post sodium chloride infusion at 125 mL/h, one-time treatment ordered by EDP Recheck BMP in the a.m.

## 2023-03-01 DIAGNOSIS — I671 Cerebral aneurysm, nonruptured: Secondary | ICD-10-CM

## 2023-03-01 DIAGNOSIS — I10 Essential (primary) hypertension: Secondary | ICD-10-CM | POA: Diagnosis not present

## 2023-03-01 DIAGNOSIS — Z7189 Other specified counseling: Secondary | ICD-10-CM | POA: Diagnosis not present

## 2023-03-01 DIAGNOSIS — Z8673 Personal history of transient ischemic attack (TIA), and cerebral infarction without residual deficits: Secondary | ICD-10-CM

## 2023-03-01 DIAGNOSIS — R410 Disorientation, unspecified: Secondary | ICD-10-CM

## 2023-03-01 DIAGNOSIS — E871 Hypo-osmolality and hyponatremia: Secondary | ICD-10-CM | POA: Diagnosis not present

## 2023-03-01 DIAGNOSIS — E039 Hypothyroidism, unspecified: Secondary | ICD-10-CM

## 2023-03-01 DIAGNOSIS — N1831 Chronic kidney disease, stage 3a: Secondary | ICD-10-CM

## 2023-03-01 LAB — BASIC METABOLIC PANEL
Anion gap: 9 (ref 5–15)
BUN: 32 mg/dL — ABNORMAL HIGH (ref 8–23)
CO2: 22 mmol/L (ref 22–32)
Calcium: 8.6 mg/dL — ABNORMAL LOW (ref 8.9–10.3)
Chloride: 96 mmol/L — ABNORMAL LOW (ref 98–111)
Creatinine, Ser: 1.2 mg/dL (ref 0.61–1.24)
GFR, Estimated: 57 mL/min — ABNORMAL LOW (ref >60)
Glucose, Bld: 102 mg/dL — ABNORMAL HIGH (ref 70–99)
Potassium: 4.3 mmol/L (ref 3.5–5.1)
Sodium: 127 mmol/L — ABNORMAL LOW (ref 135–145)

## 2023-03-01 MED ORDER — ASPIRIN 81 MG PO TBEC
81.0000 mg | DELAYED_RELEASE_TABLET | Freq: Every day | ORAL | Status: DC
  Administered 2023-03-01: 81 mg via ORAL
  Filled 2023-03-01: qty 1

## 2023-03-01 MED ORDER — LEVOTHYROXINE SODIUM 50 MCG PO TABS: 75.0000 ug | ORAL_TABLET | Freq: Every day | ORAL | Status: DC

## 2023-03-01 MED ORDER — LEVOTHYROXINE SODIUM 50 MCG PO TABS
75.0000 ug | ORAL_TABLET | Freq: Every day | ORAL | Status: DC
  Administered 2023-03-01: 75 ug via ORAL
  Filled 2023-03-01: qty 1

## 2023-03-01 MED ORDER — ATORVASTATIN CALCIUM 20 MG PO TABS
40.0000 mg | ORAL_TABLET | Freq: Every day | ORAL | Status: DC
  Administered 2023-03-01: 40 mg via ORAL
  Filled 2023-03-01: qty 2

## 2023-03-01 NOTE — Consult Note (Signed)
                                                                                   Consultation Note Date: 03/01/2023   Patient Name: Christian Garcia  DOB: 01-05-1929  MRN: [id number]  Age / Sex: 89 y.o., male  PCP: Lauro Regulus, MD Referring Physician: Arnetha Courser, MD  Reason for Consultation:  goals of care  HPI/Patient Profile: 88 y.o. male  with past medical history of CKD, brain aneurysm 5mm, hypothyroid, TIA's admitted on 02/28/2023 with altered mental status. Workup revealed hyponatremia, acute on chronic kidney injury- other differentials include possible TIA. Palliative medicine consulted for GOC.  Primary Decision Maker PATIENT  Discussion: Chart reviewed including labs, progress notes, imaging from this and previous encounters.  Labs today are normalized. On evaluation patient was awake, alert, oriented. Sitting up at bedside, eating breakfast, getting ready to work with therapy. He was looking forward to walking around the unit. Notes his GOC are to return home.     SUMMARY OF RECOMMENDATIONS -Continue current plan of care -GOC are to return home    Code Status/Advance Care Planning: Full code   Prognosis:   Unable to determine  Discharge Planning:  Home  Primary Diagnoses: Present on Admission:  Altered mental status  Essential hypertension  Brain aneurysm  Acquired hypothyroidism  HLD (hyperlipidemia)  Stage 3a chronic kidney disease (HCC)    Physical Exam Vitals and nursing note reviewed.  Constitutional:      Appearance: He is not ill-appearing.  Pulmonary:     Effort: Pulmonary effort is normal.  Neurological:     Mental Status: He is alert and oriented to person, place, and time.     Vital Signs: BP 125/69 (BP Location: Left Arm)   Pulse (!) 57   Temp (!) 97.5 F (36.4 C) (Oral)   Resp 18   Ht 5\' 7"  (1.702 m)   Wt 79.8 kg   SpO2 99%   BMI 27.57 kg/m  Pain Scale: 0-10   Pain Score: 0-No pain   SpO2: SpO2: 99 % O2  Device:SpO2: 99 % O2 Flow Rate: .   IO: Intake/output summary:  Intake/Output Summary (Last 24 hours) at 03/01/2023 1150 Last data filed at 02/28/2023 2112 Gross per 24 hour  Intake 480 ml  Output --  Net 480 ml    LBM: Last BM Date : 02/27/23 Baseline Weight: Weight: 79.8 kg Most recent weight: Weight: 79.8 kg       Thank you for this consult. Palliative medicine will continue to follow and assist as needed.  Time Total: 60 minutes Greater than 50%  of this time was spent counseling and coordinating care related to the above assessment and plan.  Signed by: Ocie Bob, AGNP-C Palliative Medicine    Please contact Palliative Medicine Team phone at [phone] for questions and concerns.  For individual provider: See Loretha Stapler

## 2023-03-01 NOTE — Discharge Summary (Addendum)
Physician Discharge Summary   Patient: Christian Garcia MRN: [id number] DOB: 22-Jan-1929  Admit date:     02/28/2023  Discharge date: 03/01/23  Discharge Physician: Arnetha Courser   PCP: Lauro Regulus, MD   Recommendations at discharge:  Please obtain CBC and BMP in 1 week Please encourage hydration Please encourage addition of some salt to his diet due to mild persistent hyponatremia Patient will get benefit from a formal cognitive evaluation which can be done as outpatient by PCP Follow-up with primary care provider within a week  Discharge Diagnoses: Principal Problem:   Altered mental status Active Problems:   Brain aneurysm   Essential hypertension   Stage 3a chronic kidney disease (HCC)   Acquired hypothyroidism   HLD (hyperlipidemia)   History of prostate cancer   Hyponatremia   Metabolic acidosis   AKI (acute kidney injury) [facility])   Hospital Course: 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    Vitals in the emergency department showed temperature of 98.9, respiration rate of 18, heart rate 75, blood pressure 156/73, SpO2 of 96% on room air.  Serum sodium is 126, potassium 4.6, chloride 94, bicarb 19, BUN of 31, serum creatinine 1.26, eGFR 54, nonfasting blood glucose 118, WBC 5.1, hemoglobin 11, platelets of 255. CTA head and neck was negative for any acute abnormality or large vessel occlusion.  Stable 5.5 mm aneurysm.  ED treatment: Sodium chloride infusion 125 ml/hr one-time dose.  Patient was recently admitted for the similar concern 2 weeks ago which resolved and presumed to be TIA.  Neurology recommended starting Plavix with aspirin at that time, patient took it for 1 week and then discontinued due to bleeding in his upper extremities.  Discussion was made with his PCP who agreed with discontinuing the Plavix given  patient's age.  7/29: Vitals and lipid panel normal.  Troponin negative, UDS negative, B12 elevated at 1078, procalcitonin negative. UA with no concern of infection.  AKI resolved.  Patient was most likely little dehydrated.  Appears to be at baseline and able to answer all the questions appropriately.  Sodium at 127.  Patient to have mild hyponatremia for the past 1 month.  Apparently does not like salt in his diet.  Not taking any medication which can cause hyponatremia.  He was advised to keep himself well-hydrated and add some salt to his diet.  PCP can follow-up.  Patient will get benefit from getting a formal cognitive evaluation.  Likely some underlying dementia due to his age.  PCP should be able to help  Patient will continue on current medications and need to keep himself well-hydrated. Physical therapist evaluated him and there are no follow-up recommendations.  Patient will follow-up with his providers for further recommendations.  Assessment and Plan: * Altered mental status Differentials include TIA, B12 deficiency, infectious etiology, UTI, pneumonia Etiology workup in progress, check UA ordered and pending collection, portable chest x-ray, procalcitonin, serum B12 level CTA head and neck w and wo contrast material ordered by EDP is pending imaging and read Check a one time troponin level Neurology service has been offered, daughter declines at this time stating that she has already discussed her father with a neurologist Admit to telemetry medical, observation  Stage 3a chronic kidney disease (HCC) At baseline  AKI (acute kidney injury) (HCC) vs increase in sCr level Baseline serum creatinine is 0.96-1.10/eGFR > 60  Status post sodium chloride 125 mL/h, one-time dose ordered by EDP Recheck BMP in a.m.  Metabolic acidosis With increase in serum creatinine level  Hyponatremia Check serum osmolality, urine osmolality, urine sodium level Patient is status post sodium  chloride infusion at 125 mL/h, one-time treatment ordered by EDP Recheck BMP in the a.m.   Consultants: None Procedures performed: None Disposition: Home Diet recommendation:  Discharge Diet Orders (From admission, onward)     Start     Ordered   03/01/23 0000  Diet - low sodium heart healthy        03/01/23 1050           Cardiac diet DISCHARGE MEDICATION: Allergies as of 03/01/2023       Reactions   Sulfa Antibiotics Other (See Comments)   Red skin        Medication List     STOP taking these medications    clopidogrel 75 MG tablet Commonly known as: PLAVIX       TAKE these medications    aspirin EC 81 MG tablet Take 1 tablet (81 mg total) by mouth daily. Swallow whole.   atorvastatin 40 MG tablet Commonly known as: LIPITOR Take 1 tablet (40 mg total) by mouth daily.   Cyanocobalamin 1000 MCG/ML Kit Inject 1,000 mcg into the muscle every 30 (thirty) days.   levothyroxine 75 MCG tablet Commonly known as: SYNTHROID Take 75 mcg by mouth daily before breakfast.   ondansetron 4 MG tablet Commonly known as: Zofran Take 1 tablet (4 mg total) by mouth every 8 (eight) hours as needed for vomiting or nausea.   traZODone 50 MG tablet Commonly known as: DESYREL Take 1 tablet by mouth at bedtime.        Follow-up Information     Lauro Regulus, MD. Go on 03/04/2023.   Specialty: Internal Medicine Why: @2 :00pm Contact information: [address] [place] [address] [phone]                Discharge Exam: Ceasar Mons Weights   02/28/23 1320  Weight: 79.8 kg   General.  Well-developed elderly man, in no acute distress. Pulmonary.  Lungs clear bilaterally, normal respiratory effort. CV.  Regular rate and rhythm, no JVD, rub or murmur. Abdomen.  Soft, nontender, nondistended, BS positive. CNS.  Alert and oriented .  No focal neurologic deficit. Extremities.  No edema, no cyanosis, pulses intact and  symmetrical. Psychiatry.  Judgment and insight appears normal.   Condition at discharge: stable  The results of significant diagnostics from this hospitalization (including imaging, microbiology, ancillary and laboratory) are listed below for reference.   Imaging Studies: CT ANGIO HEAD NECK W WO CM  Result Date: 02/28/2023 CLINICAL DATA:  TIA. EXAM: CT ANGIOGRAPHY HEAD AND NECK WITH AND WITHOUT CONTRAST TECHNIQUE: Multidetector CT imaging of the head and neck was performed using the standard protocol during bolus administration of intravenous contrast. Multiplanar CT image reconstructions and MIPs were obtained to evaluate the vascular anatomy. Carotid stenosis measurements (when applicable) are obtained utilizing NASCET criteria, using the distal internal carotid diameter as the denominator. RADIATION DOSE REDUCTION: This exam was performed according to the departmental dose-optimization program which includes automated exposure control, adjustment of the mA and/or kV according to patient size and/or use of iterative reconstruction technique. CONTRAST:  75mL OMNIPAQUE IOHEXOL 350 MG/ML SOLN COMPARISON:  MR head without and with contrast 02/16/2023. CT angio head and neck 02/16/2023. FINDINGS: CT HEAD FINDINGS Brain: Asymmetric calcification  of left basal ganglia is again noted. No acute infarct, hemorrhage, or mass lesion is present. No significant white matter lesions are present. Deep brain nuclei are within normal limits. The ventricles are of normal size. No significant extraaxial fluid collection is present. The brainstem and cerebellum are within normal limits. Midline structures are within normal limits. Vascular: No hyperdense vessel or unexpected calcification. Skull: Calvarium is intact. No focal lytic or blastic lesions are present. No significant extracranial soft tissue lesion is present. Sinuses/Orbits: The paranasal sinuses and mastoid air cells are clear. Bilateral lens replacements are  noted. Globes and orbits are otherwise unremarkable. Review of the MIP images confirms the above findings CTA NECK FINDINGS Aortic arch: Atherosclerotic calcifications are again noted at the aortic arch and great vessel origins. No focal stenosis is present. Right carotid system: Right common carotid artery is within normal limits. Atherosclerotic changes are again noted at the proximal right ICA without significant stenosis relative to the more distal vessel. More distal right ICA is normal. Left carotid system: The left common carotid artery is within normal limits. Bifurcation is unremarkable. Cervical left ICA is normal. Vertebral arteries: The right vertebral artery is the dominant vessel. Both vertebral arteries originate from the subclavian arteries. No significant stenosis is present in either vertebral artery in the neck. Skeleton: Multilevel degenerative changes of the cervical spine are stable. Other neck: Soft tissues the neck are otherwise unremarkable. Salivary glands are within normal limits. Thyroid is normal. No significant adenopathy is present. No focal mucosal or submucosal lesions are present. Upper chest: The lung apices are clear. The thoracic inlet is within normal limits. Review of the MIP images confirms the above findings CTA HEAD FINDINGS Anterior circulation: The internal carotid arteries are within normal limits skull base the ICA termini. The A1 and M1 segments are normal. The anterior communicating artery is patent. The MCA bifurcations are normal. The ACA and MCA branch vessels are within normal limits. No aneurysm is present. Posterior circulation: The vertebral arteries are codominant. PICA origins are visualized and normal. Vertebrobasilar junction basilar artery are normal. Both posterior cerebral arteries scratched at the superior cerebellar arteries are patent. Both posterior cerebral arteries originate basilar tip. A 5.5 mm aneurysm is again noted extending inferiorly from the  region proximal left P1 segment. As mention on the prior study, this could be venous. A separate developmental venous anomaly extends into the area of calcification in left basal ganglia. The PCA branch vessels are within normal limits bilaterally. Venous sinuses: The dural sinuses patent. The developmental venous anomaly extends into the internal cerebral veins. Anatomic variants: None Review of the MIP images confirms the above findings IMPRESSION: 1. Stable noncontrast CT of the head.  No acute abnormality. 2. No large vessel occlusion. 3. Stable appearance of 5.5 mm aneurysm extending inferiorly from the region of the proximal left P1 segment. As mentioned on the prior study, this could be venous. Clinically indicated, catheter angiogram could further characterize this lesion. 4. No other significant proximal stenosis, aneurysm, or branch vessel occlusion within the Circle of Willis. 5. Stable mild atherosclerotic changes at the proximal right ICA without significant stenosis relative to the more distal vessel. 6. Stable asymmetric calcification of the left basal ganglia. 7. Stable multilevel degenerative changes of the cervical spine. 8.  Aortic Atherosclerosis (ICD10-I70.0). Electronically Signed   By: Marin Roberts M.D.   On: 02/28/2023 16:17   DG Chest Port 1 View  Result Date: 02/28/2023 CLINICAL DATA:  [id number] Altered mental status [phone]  [id number] Weakness [id number] EXAM: PORTABLE CHEST 1 VIEW COMPARISON:  January 18, 2009 FINDINGS: The cardiomediastinal silhouette is unchanged in contour.Similar biapical scarring and dense osteophytes overlying the costomanubrial junctions. Atherosclerotic calcifications. No pleural effusion. No pneumothorax. No acute pleuroparenchymal abnormality. IMPRESSION: No acute cardiopulmonary abnormality. Electronically Signed   By: Meda Klinefelter M.D.   On: 02/28/2023 15:46   EEG adult  Result Date: 02/17/2023 Jefferson Fuel, MD     02/18/2023  2:53 PM Routine EEG  Report TAHJE SAVALA is a 89 y.o. male with a history of altered mental status who is undergoing an EEG to evaluate for seizures. Report: This EEG was acquired with electrodes placed according to the International 10-20 electrode system (including Fp1, Fp2, F3, F4, C3, C4, P3, P4, O1, O2, T3, T4, T5, T6, A1, A2, Fz, Cz, Pz). The following electrodes were missing or displaced: none. The occipital dominant rhythm was 8.5 Hz. This activity is reactive to stimulation. Drowsiness was manifested by background fragmentation; deeper stages of sleep were identified by K complexes and sleep spindles. There was no focal slowing. There were no interictal epileptiform discharges. There were no electrographic seizures identified. Photic stimulation and hyperventilation were not performed. Impression: This EEG was obtained while awake and asleep and is normal.   Clinical Correlation: Normal EEGs, however, do not rule out epilepsy. Bing Neighbors, MD [facility] [phone] If 7pm- 7am, please page neurology on call as listed in AMION.   ECHOCARDIOGRAM COMPLETE  Result Date: 02/17/2023    ECHOCARDIOGRAM REPORT   Patient Name:   [name] Date of Exam: 02/17/2023 Medical Rec #:  [id number]      Height:       70.0 in Accession #:    [id number]     Weight:       180.0 lb Date of Birth:  [birth year]      BSA:          1.996 m Patient Age:    89 years       BP:           129/58 mmHg Patient Gender: M              HR:           58 bpm. Exam Location:  [facility] Procedure: 2D Echo, Cardiac Doppler and Color Doppler Indications:    Stroke I63.9  History:        Patient has no prior history of Echocardiogram examinations.                 COPD.  Sonographer:    Cristela Blue Referring Phys: [id number] Verdene Lennert Diagnosing      Debbe Odea MD Phys:  Sonographer Comments: Technically challenging study due to limited acoustic windows, no parasternal window and no subcostal window. Image acquisition challenging due to COPD.  IMPRESSIONS  1. Left ventricular ejection fraction, by estimation, is 55 to 60%. The left ventricle has normal function. The left ventricle has no regional wall motion abnormalities. Left ventricular diastolic parameters are consistent with Grade I diastolic dysfunction (impaired relaxation).  2. Right ventricular systolic function is normal. The right ventricular size is normal.  3. The mitral valve is normal in structure. Mild mitral valve regurgitation.  4. The aortic valve was not well visualized. Aortic valve regurgitation is mild. FINDINGS  Left Ventricle: Left ventricular ejection fraction, by estimation, is 55 to 60%. The left ventricle has normal function. The left ventricle has no regional wall motion abnormalities. The  left ventricular internal cavity size was normal in size. There is  no left ventricular hypertrophy. Left ventricular diastolic parameters are consistent with Grade I diastolic dysfunction (impaired relaxation). Right Ventricle: The right ventricular size is normal. No increase in right ventricular wall thickness. Right ventricular systolic function is normal. Left Atrium: Left atrial size was normal in size. Right Atrium: Right atrial size was normal in size. Pericardium: There is no evidence of pericardial effusion. Mitral Valve: The mitral valve is normal in structure. Mild mitral valve regurgitation. MV peak gradient, 3.1 mmHg. The mean mitral valve gradient is 1.0 mmHg. Tricuspid Valve: The tricuspid valve is not well visualized. Tricuspid valve regurgitation is not demonstrated. Aortic Valve: The aortic valve was not well visualized. Aortic valve regurgitation is mild. Aortic valve mean gradient measures 3.0 mmHg. Aortic valve peak gradient measures 5.3 mmHg. Aortic valve area, by VTI measures 2.88 cm. Pulmonic Valve: The pulmonic valve was not well visualized. Pulmonic valve regurgitation is not visualized. Aorta: The aortic root is normal in size and structure. Venous: The inferior  vena cava was not well visualized. IAS/Shunts: No atrial level shunt detected by color flow Doppler.  LEFT VENTRICLE PLAX 2D LVIDd:         4.40 cm   Diastology LVIDs:         3.00 cm   LV e' medial:    8.70 cm/s LV PW:         1.20 cm   LV E/e' medial:  7.0 LV IVS:        1.10 cm   LV e' lateral:   13.70 cm/s LVOT diam:     2.00 cm   LV E/e' lateral: 4.4 LV SV:         70 LV SV Index:   35 LVOT Area:     3.14 cm  RIGHT VENTRICLE RV Basal diam:  3.10 cm RV Mid diam:    2.60 cm LEFT ATRIUM           Index        RIGHT ATRIUM           Index LA diam:      2.80 cm 1.40 cm/m   RA Area:     15.60 cm LA Vol (A2C): 29.6 ml 14.83 ml/m  RA Volume:   37.10 ml  18.59 ml/m LA Vol (A4C): 37.6 ml 18.84 ml/m  AORTIC VALVE AV Area (Vmax):    2.70 cm AV Area (Vmean):   2.76 cm AV Area (VTI):     2.88 cm AV Vmax:           115.00 cm/s AV Vmean:          80.800 cm/s AV VTI:            0.243 m AV Peak Grad:      5.3 mmHg AV Mean Grad:      3.0 mmHg LVOT Vmax:         99.00 cm/s LVOT Vmean:        71.100 cm/s LVOT VTI:          0.223 m LVOT/AV VTI ratio: 0.92  AORTA Ao Root diam: 3.40 cm MITRAL VALVE               TRICUSPID VALVE MV Area (PHT): 2.57 cm    TR Peak grad:   15.5 mmHg MV Area VTI:   3.02 cm    TR Vmax:        197.00  cm/s MV Peak grad:  3.1 mmHg MV Mean grad:  1.0 mmHg    SHUNTS MV Vmax:       0.88 m/s    Systemic VTI:  0.22 m MV Vmean:      53.9 cm/s   Systemic Diam: 2.00 cm MV Decel Time: 295 msec MV E velocity: 60.80 cm/s MV A velocity: 80.70 cm/s MV E/A ratio:  0.75 Debbe Odea MD Electronically signed by Debbe Odea MD Signature Date/Time: 02/17/2023/12:44:51 PM    Final    MR BRAIN W WO CONTRAST  Result Date: 02/16/2023 CLINICAL DATA:  Seizure, new onset with no history of trauma. Acute stroke suspected. Intermittent aphasia. EXAM: MRI HEAD WITHOUT AND WITH CONTRAST TECHNIQUE: Multiplanar, multiecho pulse sequences of the brain and surrounding structures were obtained without and with  intravenous contrast. CONTRAST:  7.11mL GADAVIST GADOBUTROL 1 MMOL/ML IV SOLN COMPARISON:  Head CT and CTA from earlier today. FINDINGS: Brain: No acute infarction, hemorrhage, hydrocephalus, extra-axial collection or mass lesion. Generalized cortical atrophy that is mild for age. No focal encephalomalacia. Unremarkable appearance of the medial temporal lobes. Mineralization at the left internal capsule and adjacent deep gray nuclei reflecting a developmental venous anomaly by prior CTA. No superimposed edema or discrete hemorrhage. Vascular: Developmental venous anomaly is noted above. Enhancing rounded structure ventral to the left cerebral peduncle measuring 5 mm, reference recent CTA description. Skull and upper cervical spine: Hypointense appearance at the left C1 lateral mass is attributed to degenerative sclerosis when correlated with CT. C2-3 intervertebral ankylosis. No evidence of aggressive bone lesion. Sinuses/Orbits: No acute finding IMPRESSION: 1. No acute or reversible finding. No brain mass or focal cortical finding to correlate with seizure history. 2. 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 Electronically Signed   By: Tiburcio Pea M.D.   On: 02/16/2023 14:06   CT ANGIO HEAD NECK W WO CM (CODE STROKE)  Result Date: 02/16/2023 CLINICAL DATA:  Transient ischemic attack.  Speech disturbance. EXAM: CT ANGIOGRAPHY HEAD AND NECK WITH AND WITHOUT CONTRAST TECHNIQUE: Multidetector CT imaging of the head and neck was performed using the standard protocol during bolus administration of intravenous contrast. Multiplanar CT image reconstructions and MIPs were obtained to evaluate the vascular anatomy. Carotid stenosis measurements (when applicable) are obtained utilizing NASCET criteria, using the distal internal carotid diameter as the denominator. RADIATION DOSE REDUCTION: This exam was performed according to the departmental  dose-optimization program which includes automated exposure control, adjustment of the mA and/or kV according to patient size and/or use of iterative reconstruction technique. CONTRAST:  75mL OMNIPAQUE IOHEXOL 350 MG/ML SOLN COMPARISON:  Head CT earlier same day. FINDINGS: CTA NECK FINDINGS Aortic arch: Aortic atherosclerosis. Branching pattern is normal without origin stenosis. Right carotid system: Common carotid artery is tortuous but widely patent to the bifurcation. Calcified plaque in the ICA bulb. Minimal diameter at the distal bulb is 3.9 mm. Compared to a more distal cervical ICA diameter of 6.4 mm, this indicates a 40% stenosis. Left carotid system: Common carotid artery is tortuous but widely patent to the bifurcation. Minimal plaque at the bifurcation but no stenosis. Vertebral arteries: Both vertebral artery origins widely patent. Both vertebral arteries appear normal through the cervical region to the foramen magnum. Skeleton: Chronic fusion at C2 and C3. Ordinary mid cervical spondylosis. Other neck: No mass or lymphadenopathy. Upper chest: Lung apices are clear. Review of the MIP images confirms the above findings CTA HEAD FINDINGS Anterior circulation: Both internal carotid  arteries are patent through the skull base and siphon regions. No siphon stenosis. The anterior and middle cerebral vessels are patent. No large vessel occlusion or proximal stenosis. No aneurysm or vascular malformation. Posterior circulation: Both vertebral arteries are widely patent through the foramen magnum to the basilar artery. No basilar stenosis. Posterior circulation branch vessels are patent. There is a 5.5 mm aneurysm that I think probably arises from the left P1 P2 junction. I do not see a vessel egress from this aneurysm however. 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 Numerous abnormal draining veins from  this area do raise the possibility that this could even be a venous aneurysm. The more distal PCA branches on the left are visible, but I do not see an intact PCA from the region of the aneurysm to the more distal PCA on the left. Therefore, the differential diagnosis for this includes arterial berry aneurysm, aneurysm associated with arteriovenous malformation and aneurysm associated with arteriovenous fistula. I think you would take a catheter angiogram to fully understand this lesion. The advisability of that in this 89 year old patient must be determined clinically. Venous sinuses: Patent and normal Anatomic variants: None other significant. Review of the MIP images confirms the above findings IMPRESSION: 1. 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 I do not see a vessel egress from this aneurysm however. 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 Numerous abnormal draining veins from this area do raise the possibility that this could even be a venous aneurysm. The more distal PCA branches on the left are visible, but I do not see an intact PCA from the region of the aneurysm to the more distal PCA on the left. Therefore, the differential diagnosis for this includes arterial berry aneurysm, aneurysm associated with arteriovenous malformation and aneurysm associated with arteriovenous fistula. I think catheter angiography would be necessary to fully understand this lesion. The advisability of that in this 89 year old patient must be determined clinically. 2. Aortic atherosclerosis. 3. Calcified plaque in the right ICA bulb. 40% stenosis at the distal bulb. 4. No acute intracranial anterior circulation large vessel occlusion or proximal stenosis. Aortic Atherosclerosis (ICD10-I70.0). Electronically Signed   By: Paulina Fusi M.D.   On: 02/16/2023 12:12   CT HEAD WO  CONTRAST  Result Date: 02/16/2023 CLINICAL DATA:  rule out stroke. EXAM: CT HEAD WITHOUT CONTRAST TECHNIQUE: Contiguous axial images were obtained from the base of the skull through the vertex without intravenous contrast. RADIATION DOSE REDUCTION: This exam was performed according to the departmental dose-optimization program which includes automated exposure control, adjustment of the mA and/or kV according to patient size and/or use of iterative reconstruction technique. COMPARISON:  None Available. FINDINGS: Brain: No acute intracranial hemorrhage. Asymmetric mineralization of the left basal ganglia and thalamus. Gray-white differentiation is preserved. No hydrocephalus or extra-axial collection. No mass effect or midline shift. Vascular: 6 mm ovoid structure along the course of the left PCA, possible aneurysm or nidus of arteriovenous malformation. Skull: No calvarial fracture or suspicious bone lesion. Skull base is unremarkable. Sinuses/Orbits: Unremarkable. Other: None. IMPRESSION: 1. No acute intracranial hemorrhage or evidence of acute infarction. 2. 6 mm ovoid structure along the course of the left PCA, possible aneurysm or nidus of arteriovenous malformation. CTA head is recommended for further evaluation. Code stroke  imaging results were communicated on 02/16/2023 at 11:28 am to provider Dr. Selina Cooley via telephone, who verbally acknowledged these results. Electronically Signed   By: Orvan Falconer M.D.   On: 02/16/2023 11:30    Microbiology: Results for orders placed or performed during the hospital encounter of 02/28/23  Resp panel by RT-PCR (RSV, Flu A&B, Covid) Anterior Nasal Swab     Status: None   Collection Time: 02/28/23  2:07 PM   Specimen: Anterior Nasal Swab  Result Value Ref Range Status   SARS Coronavirus 2 by RT PCR NEGATIVE NEGATIVE Final    Comment: (NOTE) SARS-CoV-2 target nucleic acids are NOT DETECTED.  The SARS-CoV-2 RNA is generally detectable in upper  respiratory specimens during the acute phase of infection. The lowest concentration of SARS-CoV-2 viral copies this assay can detect is 138 copies/mL. A negative result does not preclude SARS-Cov-2 infection and should not be used as the sole basis for treatment or other patient management decisions. A negative result may occur with  improper specimen collection/handling, submission of specimen other than nasopharyngeal swab, presence of viral mutation(s) within the areas targeted by this assay, and inadequate number of viral copies(<138 copies/mL). A negative result must be combined with clinical observations, patient history, and epidemiological information. The expected result is Negative.  Fact Sheet for Patients:  BloggerCourse.com  Fact Sheet for Healthcare Providers:  SeriousBroker.it  This test is no t yet approved or cleared by the [place] FDA and  has been authorized for detection and/or diagnosis of SARS-CoV-2 by FDA under an Emergency Use Authorization (EUA). This EUA will remain  in effect (meaning this test can be used) for the duration of the COVID-19 declaration under Section 564(b)(1) of the Act, 21 U.S.C.section 360bbb-3(b)(1), unless the authorization is terminated  or revoked sooner.       Influenza A by PCR NEGATIVE NEGATIVE Final   Influenza B by PCR NEGATIVE NEGATIVE Final    Comment: (NOTE) The Xpert Xpress SARS-CoV-2/FLU/RSV plus assay is intended as an aid in the diagnosis of influenza from Nasopharyngeal swab specimens and should not be used as a sole basis for treatment. Nasal washings and aspirates are unacceptable for Xpert Xpress SARS-CoV-2/FLU/RSV testing.  Fact Sheet for Patients: BloggerCourse.com  Fact Sheet for Healthcare Providers: SeriousBroker.it  This test is not yet approved or cleared by the [place] FDA and has been  authorized for detection and/or diagnosis of SARS-CoV-2 by FDA under an Emergency Use Authorization (EUA). This EUA will remain in effect (meaning this test can be used) for the duration of the COVID-19 declaration under Section 564(b)(1) of the Act, 21 U.S.C. section 360bbb-3(b)(1), unless the authorization is terminated or revoked.     Resp Syncytial Virus by PCR NEGATIVE NEGATIVE Final    Comment: (NOTE) Fact Sheet for Patients: BloggerCourse.com  Fact Sheet for Healthcare Providers: SeriousBroker.it  This test is not yet approved or cleared by the [place] FDA and has been authorized for detection and/or diagnosis of SARS-CoV-2 by FDA under an Emergency Use Authorization (EUA). This EUA will remain in effect (meaning this test can be used) for the duration of the COVID-19 declaration under Section 564(b)(1) of the Act, 21 U.S.C. section 360bbb-3(b)(1), unless the authorization is terminated or revoked.  Performed at [facility], [address] [place], [place] [address]     Labs: CBC: Recent Labs  Lab 02/28/23 1324  WBC 5.1  HGB 11.0*  HCT 32.4*  MCV 83.3  PLT 255   Basic Metabolic  Panel: Recent Labs  Lab 02/28/23 1324 03/01/23 0821  NA 126* 127*  K 4.6 4.3  CL 94* 96*  CO2 19* 22  GLUCOSE 118* 102*  BUN 31* 32*  CREATININE 1.26* 1.20  CALCIUM 9.1 8.6*   Liver Function Tests: Recent Labs  Lab 02/28/23 1324  AST 27  ALT 26  ALKPHOS 42  BILITOT 0.8  PROT 7.5  ALBUMIN 3.9   CBG: No results for input(s): "GLUCAP" in the last 168 hours.  Discharge time spent: greater than 30 minutes.  This record has been created using Conservation officer, historic buildings. Errors have been sought and corrected,but may not always be located. Such creation errors do not reflect on the standard of care.   Signed: Arnetha Courser, MD [facility] 03/01/2023

## 2023-03-01 NOTE — Evaluation (Signed)
Occupational Therapy Evaluation Patient Details Name: WASIL TRITLE MRN: [id number] DOB: Mar 27, 1929 Today's Date: 03/01/2023   History of Present Illness Pt is a 89 y.o. male presenting to hospital 7/28 with c/o AMS.  Recent diagnosis of 5mm AVM.  Pt admitted with AMS, stage 3a CKD, AKI, metabolic acidosis, and hyponatremia.  PMH includes aneurysm/brain AVM, COPD, HLD, hypothyroid, CKD 3A, R foot sx, prostate sx.   Clinical Impression   Patient received for OT evaluation. See flowsheet below for details of function. Pt (I) for ADLs. Will benefit from SLP cognitive evaluation, as his primary deficit appears to be mild cognitive deficits such as date, tangential speech; unclear what pt's baseline is. May benefit from driver evaluation.        Recommendations for follow up therapy are one component of a multi-disciplinary discharge planning process, led by the attending physician.  Recommendations may be updated based on patient status, additional functional criteria and insurance authorization.   Assistance Recommended at Discharge PRN  Patient can return home with the following Direct supervision/assist for medications management;Direct supervision/assist for financial management    Functional Status Assessment  Patient has not had a recent decline in their functional status  Equipment Recommendations       Recommendations for Other Services       Precautions / Restrictions Precautions Precautions: Fall Restrictions Weight Bearing Restrictions: No      Mobility Bed Mobility               General bed mobility comments: Not tested;  pt in recliner at start and end of session.    Transfers Overall transfer level: Independent Equipment used: None                      Balance Overall balance assessment: Needs assistance Sitting-balance support: No upper extremity supported, Feet supported Sitting balance-Leahy Scale: Normal     Standing balance support: No  upper extremity supported, During functional activity Standing balance-Leahy Scale: Good                             ADL either performed or assessed with clinical judgement   ADL Overall ADL's : At baseline                                       General ADL Comments: (I) donning pants, socks, shoes today from standing/sitting position with extra time. Walked 3 laps around unit with no AD, no loss of balance.     Vision Baseline Vision/History: 1 Wears glasses Ability to See in Adequate Light: 0 Adequate       Perception     Praxis      Pertinent Vitals/Pain Pain Assessment Pain Assessment: No/denies pain     Hand Dominance     Extremity/Trunk Assessment Upper Extremity Assessment Upper Extremity Assessment: Overall WFL for tasks assessed   Lower Extremity Assessment Lower Extremity Assessment: Overall WFL for tasks assessed   Cervical / Trunk Assessment Cervical / Trunk Assessment: Normal   Communication Communication Communication: HOH   Cognition Arousal/Alertness: Awake/alert Behavior During Therapy: WFL for tasks assessed/performed Overall Cognitive Status: No family/caregiver present to determine baseline cognitive functioning  General Comments: Oriented to person, place, grossly to time (knows day of week and month; within a few days of date); not much detail on why he's here. Distractible. Talking a lot while trying to finish breakfast. Tangential in speech pattern; will benefit from SLP cognitive evaluation.     General Comments  On room air.    Exercises     Shoulder Instructions      Home Living Family/patient expects to be discharged to:: Private residence Living Arrangements: Alone Available Help at Discharge: Available PRN/intermittently (sister in the area; daughter out of state) Type of Home: House Home Access: Stairs to enter Secretary/administrator of Steps:  2 Entrance Stairs-Rails: None Home Layout: Other (Comment) (split level, 1 step in home)     Bathroom Shower/Tub: Chief Strategy Officer: Standard     Home Equipment: None   Additional Comments: handheld shower head      Prior Functioning/Environment Prior Level of Function : Independent/Modified Independent;Driving             Mobility Comments: States he has not fallen; drives. No AD use. ADLs Comments: (I) ADL/IADL.        OT Problem List:        OT Treatment/Interventions:      OT Goals(Current goals can be found in the care plan section) Acute Rehab OT Goals Patient Stated Goal: Go home OT Goal Formulation: All assessment and education complete, DC therapy  OT Frequency:      Co-evaluation              AM-PAC OT "6 Clicks" Daily Activity     Outcome Measure Help from another person eating meals?: None Help from another person taking care of personal grooming?: None Help from another person toileting, which includes using toliet, bedpan, or urinal?: None Help from another person bathing (including washing, rinsing, drying)?: None Help from another person to put on and taking off regular upper body clothing?: None Help from another person to put on and taking off regular lower body clothing?: None 6 Click Score: 24   End of Session Nurse Communication: Mobility status  Activity Tolerance: Patient tolerated treatment well Patient left: in chair;with chair alarm set;with call bell/phone within reach (visitor walked in a few minutes after OT departure)                   Time: 2952-8413 OT Time Calculation (min): 40 min Charges:  OT General Charges $OT Visit: 1 Visit OT Evaluation $OT Eval Moderate Complexity: 1 Mod OT Treatments $Self Care/Home Management : 8-22 mins  Linward Foster, MS, OTR/L  Alvester Morin 03/01/2023, 11:50 AM

## 2023-03-01 NOTE — Evaluation (Signed)
Physical Therapy Evaluation Patient Details Name: Christian Garcia MRN: [id number] DOB: Jan 07, 1929 Today's Date: 03/01/2023  History of Present Illness  Pt is a 89 y.o. male presenting to hospital 7/28 with c/o AMS.  Recent diagnosis of 5mm AVM.  Pt admitted with AMS, stage 3a CKD, AKI, metabolic acidosis, and hyponatremia.  PMH includes aneurysm/brain AVM, COPD, HLD, hypothyroid, CKD 3A, R foot sx, prostate sx.  Clinical Impression  Prior to recent medical concerns, pt was independent with ambulation; lives alone; no recent falls reported.  Pt oriented to person, place, and time but not situation/why he was in hospital.  Some generalized confusion noted.  Currently pt is modified independent semi-supine to sitting edge of bed; independent with transfers; and CGA to ambulate 200 feet (no AD use).  Occasional altered stepping pattern noted with ambulation but pt able to safely self correct (pt reports he normally wears shoes when ambulating which improves his balance).  Pt would currently benefit from skilled PT to address noted impairments and functional limitations (see below for any additional details).  Upon hospital discharge, anticipate no further PT needs.     If plan is discharge home, recommend the following: Assist for transportation;Help with stairs or ramp for entrance   Can travel by private vehicle    Yes    Equipment Recommendations None recommended by PT  Recommendations for Other Services       Functional Status Assessment Patient has had a recent decline in their functional status and demonstrates the ability to make significant improvements in function in a reasonable and predictable amount of time.     Precautions / Restrictions Precautions Precautions: Fall Restrictions Weight Bearing Restrictions: No      Mobility  Bed Mobility Overal bed mobility: Modified Independent             General bed mobility comments: Semi-supine to sitting edge of bed with use of  bed rail    Transfers Overall transfer level: Independent Equipment used: None               General transfer comment: steady safe transfer from bed    Ambulation/Gait Ambulation/Gait assistance: Min guard Gait Distance (Feet): 200 Feet Assistive device: None Gait Pattern/deviations: Step-through pattern Gait velocity: mildly decreased     General Gait Details: occasional altered stepping pattern but able to self correct (pt reports d/t not wearing his shoes which he always wears when walking inside/out of home--pt wearing hospital non-skid socks)  Stairs            Wheelchair Mobility     Tilt Bed    Modified Rankin (Stroke Patients Only)       Balance Overall balance assessment: Needs assistance Sitting-balance support: No upper extremity supported, Feet supported Sitting balance-Leahy Scale: Normal Sitting balance - Comments: steady sitting reaching outside BOS   Standing balance support: No upper extremity supported, During functional activity Standing balance-Leahy Scale: Good Standing balance comment: occasional altered stepping pattern with walking but pt able to self correct                             Pertinent Vitals/Pain Pain Assessment Pain Assessment: No/denies pain Vitals (HR and SpO2 on room air) stable and WFL throughout treatment session.    Home Living Family/patient expects to be discharged to:: Private residence Living Arrangements: Alone Available Help at Discharge: Available PRN/intermittently (Sister lives in area; daughter lives out of state (calls to check on  pt)) Type of Home: House Home Access: Stairs to enter Entrance Stairs-Rails: None Entrance Stairs-Number of Steps: 2   Home Layout:  (split level with 1 step to navigate in home) Home Equipment: None Additional Comments: Installing grab bars; has hand held shower head    Prior Function Prior Level of Function : Independent/Modified Independent;Driving              Mobility Comments: No recent falls reported; (+) driving ADLs Comments: Sister occasionally brings pt meals     Hand Dominance        Extremity/Trunk Assessment   Upper Extremity Assessment Upper Extremity Assessment: Overall WFL for tasks assessed    Lower Extremity Assessment Lower Extremity Assessment: Generalized weakness    Cervical / Trunk Assessment Cervical / Trunk Assessment: Normal  Communication   Communication: HOH  Cognition Arousal/Alertness: Awake/alert Behavior During Therapy: WFL for tasks assessed/performed Overall Cognitive Status: No family/caregiver present to determine baseline cognitive functioning                                 General Comments: Oriented to person, place, and time (not situation).  Pt had difficulty using/understanding phone in room.  Occasional generalized confusion noted.        General Comments  Nursing cleared pt for participation in physical therapy.  Pt agreeable to PT session.    Exercises     Assessment/Plan    PT Assessment Patient needs continued PT services  PT Problem List Decreased strength;Decreased balance;Decreased mobility;Decreased cognition       PT Treatment Interventions DME instruction;Gait training;Stair training;Functional mobility training;Therapeutic activities;Therapeutic exercise;Balance training;Patient/family education    PT Goals (Current goals can be found in the Care Plan section)  Acute Rehab PT Goals Patient Stated Goal: to go home PT Goal Formulation: With patient Time For Goal Achievement: 03/15/23 Potential to Achieve Goals: Good    Frequency Min 1X/week     Co-evaluation               AM-PAC PT "6 Clicks" Mobility  Outcome Measure Help needed turning from your back to your side while in a flat bed without using bedrails?: None Help needed moving from lying on your back to sitting on the side of a flat bed without using bedrails?:  None Help needed moving to and from a bed to a chair (including a wheelchair)?: None Help needed standing up from a chair using your arms (e.g., wheelchair or bedside chair)?: None Help needed to walk in hospital room?: A Little Help needed climbing 3-5 steps with a railing? : A Little 6 Click Score: 22    End of Session Equipment Utilized During Treatment: Gait belt Activity Tolerance: Patient tolerated treatment well Patient left: in chair;with call bell/phone within reach;with chair alarm set Nurse Communication: Mobility status;Precautions PT Visit Diagnosis: Unsteadiness on feet (R26.81);Muscle weakness (generalized) (M62.81)    Time: 9562-1308 PT Time Calculation (min) (ACUTE ONLY): 20 min   Charges:   PT Evaluation $PT Eval Low Complexity: 1 Low   PT General Charges $$ ACUTE PT VISIT: 1 Visit        Hendricks Limes, PT 03/01/23, 10:27 AM

## 2023-03-01 NOTE — Evaluation (Signed)
Speech Language Pathology Evaluation Patient Details Name: DELSON JOENS MRN: [id number] DOB: 1928-12-19 Today's Date: 03/01/2023 Time: 5284-1324 SLP Time Calculation (min) (ACUTE ONLY): 25 min  Problem List:  Patient Active Problem List   Diagnosis Date Noted   Altered mental status 02/28/2023   Hyponatremia 02/28/2023   Metabolic acidosis 02/28/2023   AKI (acute kidney injury) (HCC) 02/28/2023   TIA (transient ischemic attack) 02/17/2023   Expressive aphasia 02/16/2023   Essential hypertension 02/16/2023   Prediabetes 02/16/2023   Brain aneurysm 02/16/2023   SBO (small bowel obstruction) (HCC) 01/26/2020   History of prostate cancer 01/26/2020   History of small bowel obstruction 01/26/2020   Small bowel obstruction (HCC) 01/26/2020   Stage 3a chronic kidney disease (HCC) 11/08/2019   Prostate cancer (HCC) 05/31/2015   Health care maintenance 04/11/2015   Acquired hypothyroidism 03/31/2014   HLD (hyperlipidemia) 03/31/2014   PA (pernicious anemia) 03/31/2014   Past Medical History:  Past Medical History:  Diagnosis Date   Altered bowel habits    Anemia    Aneurysm (HCC) 02/2023   brain aneurysm, diagnosed 7/24   BPH (benign prostatic hypertrophy)    COPD (chronic obstructive pulmonary disease) (HCC)    pt and sister state that does NOT have COPD   Elevated PSA    History of small bowel obstruction    Hypothyroidism    Peyronie's disease    Prostate cancer [facility])    Past Surgical History:  Past Surgical History:  Procedure Laterality Date   APPENDECTOMY     CATARACT EXTRACTION W/PHACO Left 03/24/2017   Procedure: CATARACT EXTRACTION PHACO AND INTRAOCULAR LENS PLACEMENT (IOC) LEFT;  Surgeon: Lockie Mola, MD;  Location: [facility];  Service: Ophthalmology;  Laterality: Left;   CATARACT EXTRACTION W/PHACO Right 05/05/2017   Procedure: CATARACT EXTRACTION PHACO AND INTRAOCULAR LENS PLACEMENT (IOC) RIGHT;  Surgeon: Lockie Mola, MD;  Location:  [facility];  Service: Ophthalmology;  Laterality: Right;   COLONOSCOPY WITH PROPOFOL N/A 02/26/2020   Procedure: COLONOSCOPY WITH PROPOFOL;  Surgeon: Regis Bill, MD;  Location: [facility];  Service: Endoscopy;  Laterality: N/A;   CRYOABLATION  2009   prostate   ESOPHAGOGASTRODUODENOSCOPY (EGD) WITH PROPOFOL N/A 02/26/2020   Procedure: ESOPHAGOGASTRODUODENOSCOPY (EGD) WITH PROPOFOL;  Surgeon: Regis Bill, MD;  Location: [facility];  Service: Endoscopy;  Laterality: N/A;   FOOT SURGERY  right   ganglion cyst removed   PROSTATE SURGERY     HPI:  Mr. Muhanad Bjornson is a 89 year old male with history of cerebral aneurysm that is nonruptured, hyperlipidemia, hypothyroid, CKD 3A, who presents for chief concerns of altered mental status.   Assessment / Plan / Recommendation Clinical Impression  Pt seen for cognitive linguistic evaluation in the setting of AMS. Assessment consisting of pt interview and partial completion of the Mini Mental State Examination. Pt presents with concern for cognitive communication deficit, c/b challenges with attention and complex problem solving. Pt with tangential speech pattern limiting awareness/attention for completion of formal cognitive screen. Verbal cues (binary options and semantic cues), redirection, and repetition bolstered cognitive processing. Independent expressive/receptive language and visual processing demonstrated during assessment. Min verbal cues for use of orientation board bolstered orientation. Motor speech notable grossly intact intelligibility and voicing. Pt with limited awareness for current deficits, reporting improvement from initial presentation with "getting words out." Recommend use of written aids, repetition, and clear instructions for optimum processing. No family present for to determine variance from baseline. Based on presentation, agree with MD regarding concern for  underlying cognitive decline. Recommend  follow up with PCP for assessment/referrals. No further acute SLP needs.    SLP Assessment  SLP Recommendation/Assessment: Patient does not need any further Speech Lanaguage Pathology Services SLP Visit Diagnosis: Cognitive communication deficit (R41.841)    Recommendations for follow up therapy are one component of a multi-disciplinary discharge planning process, led by the attending physician.  Recommendations may be updated based on patient status, additional functional criteria and insurance authorization.    Follow Up Recommendations  No SLP follow up    Assistance Recommended at Discharge  Intermittent Supervision/Assistance  Functional Status Assessment Patient has not had a recent decline in their functional status  Frequency and Duration           SLP Evaluation Cognition  Overall Cognitive Status: No family/caregiver present to determine baseline cognitive functioning Arousal/Alertness: Awake/alert Orientation Level: Oriented X4 Attention: Sustained Sustained Attention: Impaired Sustained Attention Impairment: Verbal basic;Functional basic Memory:  (longeterm intact- pt recalling 2/2 items from breakfast tray) Awareness: Impaired Problem Solving:  (needs further assessment) Executive Function: Organizing;Sequencing Sequencing: Impaired Sequencing Impairment: IT trainer: Impaired Organizing Impairment: Verbal basic Behaviors:  (tangential) Safety/Judgment:  (needs further assessment)       Comprehension  Auditory Comprehension Overall Auditory Comprehension: Appears within functional limits for tasks assessed Visual Recognition/Discrimination Discrimination: Within Function Limits Reading Comprehension Reading Status: Within funtional limits    Expression Expression Primary Mode of Expression: Verbal Verbal Expression Overall Verbal Expression: Appears within functional limits for tasks assessed Written Expression Written Expression: Not tested    Oral / Motor  Oral Motor/Sensory Function Overall Oral Motor/Sensory Function: Within functional limits Motor Speech Overall Motor Speech: Appears within functional limits for tasks assessed    [place] Averly Ericson Clapp  MS [facility] SLP          [place] J Clapp 03/01/2023, 1:43 PM

## 2023-03-15 ENCOUNTER — Ambulatory Visit (HOSPITAL_COMMUNITY): Payer: Medicare HMO

## 2023-03-30 ENCOUNTER — Other Ambulatory Visit (HOSPITAL_COMMUNITY): Payer: Self-pay | Admitting: Interventional Radiology

## 2023-03-30 ENCOUNTER — Telehealth (HOSPITAL_COMMUNITY): Payer: Self-pay

## 2023-03-30 DIAGNOSIS — Q2730 Arteriovenous malformation, site unspecified: Secondary | ICD-10-CM

## 2023-03-30 DIAGNOSIS — I671 Cerebral aneurysm, nonruptured: Secondary | ICD-10-CM

## 2023-03-30 NOTE — Telephone Encounter (Signed)
Called to schedule consult w/Dr. Corliss Skains. He said that he had someone on the line and he could not talk to me. He got off the phone. I will try back at another time. AB

## 2023-05-07 ENCOUNTER — Other Ambulatory Visit: Payer: Self-pay | Admitting: Urology

## 2023-05-07 DIAGNOSIS — C61 Malignant neoplasm of prostate: Secondary | ICD-10-CM

## 2023-05-07 NOTE — Progress Notes (Unsigned)
2:46 PM   GRACYN Garcia 03/18/1929 [id number]  Referring provider: Lauro Regulus, MD [address] [facility] [place],  [place] [address]  Urological history: 1. Prostate cancer -PSA pending -cryoablation in September 2009 for a Gleason's 7 adenocarcinoma of the prostate  2. BPH with LU TS -prostate volume 59 cc on 2021 CT  No chief complaint on file.   HPI: Christian Garcia is a 89 y.o. male who presents today for yearly follow up.   Previous records reviewed.   Was admitted to the hospital for altered mental status in July.         Score:  1-7 Mild 8-19 Moderate 20-35 Severe  PMH: Past Medical History:  Diagnosis Date   Altered bowel habits    Anemia    Aneurysm (HCC) 02/2023   brain aneurysm, diagnosed 7/24   BPH (benign prostatic hypertrophy)    COPD (chronic obstructive pulmonary disease) (HCC)    pt and sister state that does NOT have COPD   Elevated PSA    History of small bowel obstruction    Hypothyroidism    Peyronie's disease    Prostate cancer [facility])     Surgical History: Past Surgical History:  Procedure Laterality Date   APPENDECTOMY     CATARACT EXTRACTION W/PHACO Left 03/24/2017   Procedure: CATARACT EXTRACTION PHACO AND INTRAOCULAR LENS PLACEMENT (IOC) LEFT;  Surgeon: Christian Mola, MD;  Location: [facility];  Service: Ophthalmology;  Laterality: Left;   CATARACT EXTRACTION W/PHACO Right 05/05/2017   Procedure: CATARACT EXTRACTION PHACO AND INTRAOCULAR LENS PLACEMENT (IOC) RIGHT;  Surgeon: Christian Mola, MD;  Location: [facility];  Service: Ophthalmology;  Laterality: Right;   COLONOSCOPY WITH PROPOFOL N/A 02/26/2020   Procedure: COLONOSCOPY WITH PROPOFOL;  Surgeon: Christian Bill, MD;  Location: [facility];  Service: Endoscopy;  Laterality: N/A;   CRYOABLATION  2009   prostate   ESOPHAGOGASTRODUODENOSCOPY (EGD) WITH PROPOFOL N/A 02/26/2020   Procedure:  ESOPHAGOGASTRODUODENOSCOPY (EGD) WITH PROPOFOL;  Surgeon: Christian Bill, MD;  Location: [facility];  Service: Endoscopy;  Laterality: N/A;   FOOT SURGERY  right   ganglion cyst removed   PROSTATE SURGERY      Home Medications:  Allergies as of 05/10/2023       Reactions   Sulfa Antibiotics Other (See Comments)   Red skin        Medication List        Accurate as of May 07, 2023  2:46 PM. If you have any questions, ask your nurse or doctor.          aspirin EC 81 MG tablet Take 1 tablet (81 mg total) by mouth daily. Swallow whole.   atorvastatin 40 MG tablet Commonly known as: LIPITOR Take 1 tablet (40 mg total) by mouth daily.   Cyanocobalamin 1000 MCG/ML Kit Inject 1,000 mcg into the muscle every 30 (thirty) days.   levothyroxine 75 MCG tablet Commonly known as: SYNTHROID Take 75 mcg by mouth daily before breakfast.   ondansetron 4 MG tablet Commonly known as: Zofran Take 1 tablet (4 mg total) by mouth every 8 (eight) hours as needed for vomiting or nausea.   traZODone 50 MG tablet Commonly known as: DESYREL Take 1 tablet by mouth at bedtime.        Allergies:  Allergies  Allergen Reactions   Sulfa Antibiotics Other (See Comments)    Red skin    Family History: Family History  Problem Relation  Age of Onset   Prostate cancer Brother        x 3   Breast cancer Sister    Kidney disease Neg Hx    Kidney cancer Neg Hx    Bladder Cancer Neg Hx     Social History:  reports that he has never smoked. He has never used smokeless tobacco. He reports that he does not drink alcohol and does not use drugs.  ROS: For pertinent review of systems please refer to history of present illness  Physical Exam: There were no vitals taken for this visit.  Constitutional:  Well nourished. Alert and oriented, No acute distress. HEENT: [place] AT, moist mucus membranes.  Trachea midline Cardiovascular: No clubbing, cyanosis, or edema. Respiratory: Normal  respiratory effort, no increased work of breathing. Neurologic: Grossly intact, no focal deficits, moving all 4 extremities. Psychiatric: Normal mood and affect.   Laboratory Data: Basic Metabolic Panel (BMP) Order: [id number] Component Ref Range & Units 3 wk ago  Glucose 70 - 110 mg/dL 99  Sodium 409 - 811 mmol/L 135 Low   Potassium 3.6 - 5.1 mmol/L 4.9  Chloride 97 - 109 mmol/L 99  Carbon Dioxide (CO2) 22.0 - 32.0 mmol/L 29.6  Calcium 8.7 - 10.3 mg/dL 9.3  Urea Nitrogen (BUN) 7 - 25 mg/dL 41 High   Creatinine 0.7 - 1.3 mg/dL 0.9  Glomerular Filtration Rate (eGFR) >60 mL/min/1.73sq m 80  Comment: CKD-EPI (2021) does not include patient's race in the calculation of eGFR.  Monitoring changes of plasma creatinine and eGFR over time is useful for monitoring kidney function.  Interpretive Ranges for eGFR (CKD-EPI 2021):  eGFR:       >60 mL/min/1.73 sq. m - Normal eGFR:       30-59 mL/min/1.73 sq. m - Moderately Decreased eGFR:       15-29 mL/min/1.73 sq. m  - Severely Decreased eGFR:       < 15 mL/min/1.73 sq. m  - Kidney Failure   Note: These eGFR calculations do not apply in acute situations when eGFR is changing rapidly or patients on dialysis.  BUN/Crea Ratio 6.0 - 20.0 45.6 High   Anion Gap w/K 6.0 - 16.0 11.3  Resulting Agency [facility]  Narrative  This result has an attachment that is not available.   Specimen Collected: 04/13/23 11:41   Performed by: [facility] Last Resulted: 04/13/23 17:05  Received From: [facility]  Result Received: 04/16/23 09:16   CBC w/auto Differential (5 Part) Order: [id number] Component Ref Range & Units 2 mo ago  WBC (White Blood Cell Count) 4.1 - 10.2 10^3/uL 4.1  RBC (Red Blood Cell Count) 4.69 - 6.13 10^6/uL 3.41 Low   Hemoglobin 14.1 - 18.1 gm/dL 9.9 Low   Hematocrit 21.3 - 52.0 % 28.5 Low   MCV (Mean Corpuscular Volume) 80.0 - 100.0 fl 83.6  MCH (Mean Corpuscular  Hemoglobin) 27.0 - 31.2 pg 29.0  MCHC (Mean Corpuscular Hemoglobin Concentration) 32.0 - 36.0 gm/dL 08.6  Platelet Count 578 - 450 10^3/uL 199  RDW-CV (Red Cell Distribution Width) 11.6 - 14.8 % 12.3  MPV (Mean Platelet Volume) 9.4 - 12.4 fl 11.0  Neutrophils 1.50 - 7.80 10^3/uL 2.24  Lymphocytes 1.00 - 3.60 10^3/uL 1.28  Monocytes 0.00 - 1.50 10^3/uL 0.46  Eosinophils 0.00 - 0.55 10^3/uL 0.05  Basophils 0.00 - 0.09 10^3/uL 0.03  Neutrophil % 32.0 - 70.0 % 55.2  Lymphocyte % 10.0 - 50.0 % 31.4  Monocyte %  4.0 - 13.0 % 11.3  Eosinophil % 1.0 - 5.0 % 1.2  Basophil% 0.0 - 2.0 % 0.7  Immature Granulocyte % <=0.7 % 0.2  Immature Granulocyte Count <=0.06 10^3/L 0.01  Resulting Agency [facility]   Specimen Collected: 03/04/23 14:54   Performed by: [facility] Last Resulted: 03/04/23 16:14  Received From: [facility]  Result Received: 03/08/23 17:33  I have reviewed the labs.     Pertinent imaging No recent imaging  Assessment & Plan:    1. Prostate cancer [facility]):    -PSA pending   2. BPH with Mild LUTS -symptoms - nocturia x 1 -continue conservative management, avoiding bladder irritants and timed voiding's    No follow-ups on file.  Cloretta Ned  [facility] [address] [place], [place] [address] [phone]

## 2023-05-10 ENCOUNTER — Encounter: Payer: Self-pay | Admitting: Urology

## 2023-05-10 ENCOUNTER — Other Ambulatory Visit: Payer: Self-pay | Admitting: Urology

## 2023-05-10 ENCOUNTER — Other Ambulatory Visit
Admission: RE | Admit: 2023-05-10 | Discharge: 2023-05-10 | Disposition: A | Discharge status: Home / Self Care | Payer: Medicare HMO | Attending: Urology | Admitting: Urology

## 2023-05-10 ENCOUNTER — Ambulatory Visit: Payer: Medicare HMO | Admitting: Urology

## 2023-05-10 VITALS — BP 148/73 | HR 67 | Ht 70.0 in | Wt 169.8 lb

## 2023-05-10 DIAGNOSIS — C61 Malignant neoplasm of prostate: Secondary | ICD-10-CM | POA: Insufficient documentation

## 2023-05-10 DIAGNOSIS — Z8546 Personal history of malignant neoplasm of prostate: Secondary | ICD-10-CM

## 2023-05-10 DIAGNOSIS — N401 Enlarged prostate with lower urinary tract symptoms: Secondary | ICD-10-CM | POA: Diagnosis not present

## 2023-05-10 LAB — PSA: Prostatic Specific Antigen: 0.54 ng/mL (ref 0.00–4.00)

## 2023-05-20 ENCOUNTER — Ambulatory Visit: Payer: Medicare HMO | Admitting: Neurology
